# Patient Record
Sex: Female | Born: 1965 | Race: White | Hispanic: No | Marital: Married | Smoking: Former smoker
Health system: Southern US, Community
[De-identification: ages and names within clinical notes are randomized; demographics above are authoritative.]

## PROBLEM LIST (undated history)

## (undated) DIAGNOSIS — F419 Anxiety disorder, unspecified: Secondary | ICD-10-CM

## (undated) DIAGNOSIS — D352 Benign neoplasm of pituitary gland: Secondary | ICD-10-CM

## (undated) DIAGNOSIS — E079 Disorder of thyroid, unspecified: Secondary | ICD-10-CM

## (undated) DIAGNOSIS — F909 Attention-deficit hyperactivity disorder, unspecified type: Secondary | ICD-10-CM

## (undated) DIAGNOSIS — Z973 Presence of spectacles and contact lenses: Secondary | ICD-10-CM

## (undated) HISTORY — PX: ABDOMINAL HYSTERECTOMY: SHX81

## (undated) HISTORY — DX: Anxiety disorder, unspecified: F41.9

## (undated) HISTORY — DX: Benign neoplasm of pituitary gland: D35.2

## (undated) HISTORY — PX: KIDNEY STONE SURGERY: SHX686

---

## 2004-07-24 ENCOUNTER — Ambulatory Visit: Payer: Self-pay | Admitting: Unknown Physician Specialty

## 2004-10-09 ENCOUNTER — Ambulatory Visit: Payer: Self-pay | Admitting: Unknown Physician Specialty

## 2008-09-02 ENCOUNTER — Emergency Department: Payer: Self-pay | Admitting: Emergency Medicine

## 2008-12-20 ENCOUNTER — Ambulatory Visit: Payer: Self-pay | Admitting: Urology

## 2009-09-03 ENCOUNTER — Ambulatory Visit: Payer: Self-pay | Admitting: Family Medicine

## 2009-09-10 ENCOUNTER — Ambulatory Visit: Payer: Self-pay | Admitting: Unknown Physician Specialty

## 2009-09-25 ENCOUNTER — Ambulatory Visit: Payer: Self-pay | Admitting: Internal Medicine

## 2011-10-22 ENCOUNTER — Ambulatory Visit: Payer: Self-pay | Admitting: Internal Medicine

## 2012-02-12 ENCOUNTER — Ambulatory Visit: Payer: Self-pay | Admitting: Internal Medicine

## 2012-02-12 LAB — URINALYSIS, COMPLETE
Bilirubin,UR: NEGATIVE
Glucose,UR: NEGATIVE mg/dL (ref 0–75)
Ketone: NEGATIVE
Nitrite: NEGATIVE
Ph: 7 (ref 4.5–8.0)
Protein: NEGATIVE

## 2012-02-12 LAB — WET PREP, GENITAL

## 2012-02-15 LAB — URINE CULTURE

## 2012-11-01 ENCOUNTER — Ambulatory Visit: Payer: Self-pay | Admitting: Family Medicine

## 2013-06-13 ENCOUNTER — Ambulatory Visit: Payer: Self-pay | Admitting: Obstetrics and Gynecology

## 2013-06-13 LAB — CBC WITH DIFFERENTIAL/PLATELET
BASOS PCT: 0.9 %
Basophil #: 0.1 10*3/uL (ref 0.0–0.1)
EOS ABS: 0.1 10*3/uL (ref 0.0–0.7)
EOS PCT: 1.2 %
HCT: 38.3 % (ref 35.0–47.0)
HGB: 12.6 g/dL (ref 12.0–16.0)
Lymphocyte #: 1.5 10*3/uL (ref 1.0–3.6)
Lymphocyte %: 25.8 %
MCH: 30.8 pg (ref 26.0–34.0)
MCHC: 32.9 g/dL (ref 32.0–36.0)
MCV: 94 fL (ref 80–100)
MONO ABS: 0.4 x10 3/mm (ref 0.2–0.9)
Monocyte %: 6.2 %
Neutrophil #: 3.8 10*3/uL (ref 1.4–6.5)
Neutrophil %: 65.9 %
Platelet: 246 10*3/uL (ref 150–440)
RBC: 4.1 10*6/uL (ref 3.80–5.20)
RDW: 13.7 % (ref 11.5–14.5)
WBC: 5.8 10*3/uL (ref 3.6–11.0)

## 2013-06-13 LAB — COMPREHENSIVE METABOLIC PANEL
ALBUMIN: 4.1 g/dL (ref 3.4–5.0)
ALT: 19 U/L (ref 12–78)
AST: 18 U/L (ref 15–37)
Alkaline Phosphatase: 58 U/L
Anion Gap: 7 (ref 7–16)
BUN: 10 mg/dL (ref 7–18)
Bilirubin,Total: 0.3 mg/dL (ref 0.2–1.0)
CHLORIDE: 105 mmol/L (ref 98–107)
CO2: 30 mmol/L (ref 21–32)
Calcium, Total: 8.7 mg/dL (ref 8.5–10.1)
Creatinine: 0.86 mg/dL (ref 0.60–1.30)
EGFR (African American): >60
Glucose: 112 mg/dL — ABNORMAL HIGH (ref 65–99)
OSMOLALITY: 283 (ref 275–301)
POTASSIUM: 4.3 mmol/L (ref 3.5–5.1)
Sodium: 142 mmol/L (ref 136–145)
Total Protein: 7 g/dL (ref 6.4–8.2)

## 2013-06-13 LAB — LIPID PANEL
Cholesterol: 201 mg/dL — ABNORMAL HIGH (ref 0–200)
HDL Cholesterol: 96 mg/dL — ABNORMAL HIGH (ref 40–60)
LDL CHOLESTEROL, CALC: 88 mg/dL (ref 0–100)
Triglycerides: 83 mg/dL (ref 0–200)
VLDL Cholesterol, Calc: 17 mg/dL (ref 5–40)

## 2013-06-13 LAB — TSH: Thyroid Stimulating Horm: 1 u[IU]/mL

## 2013-07-14 ENCOUNTER — Ambulatory Visit: Payer: Self-pay

## 2013-07-14 LAB — URINALYSIS, COMPLETE
Bilirubin,UR: NEGATIVE
Glucose,UR: NEGATIVE mg/dL (ref 0–75)
KETONE: NEGATIVE
Leukocyte Esterase: NEGATIVE
Nitrite: NEGATIVE
PH: 5 (ref 4.5–8.0)
Protein: NEGATIVE
Specific Gravity: >=1.03 (ref 1.003–1.030)

## 2013-07-14 LAB — WET PREP, GENITAL

## 2013-07-14 LAB — GC/CHLAMYDIA PROBE AMP

## 2013-07-16 LAB — URINE CULTURE

## 2014-04-12 ENCOUNTER — Ambulatory Visit: Payer: Self-pay | Admitting: Internal Medicine

## 2014-09-21 ENCOUNTER — Ambulatory Visit
Admission: EM | Admit: 2014-09-21 | Discharge: 2014-09-21 | Disposition: A | Discharge status: Home / Self Care | Payer: BC Managed Care – PPO | Attending: Internal Medicine | Admitting: Internal Medicine

## 2014-09-21 ENCOUNTER — Encounter: Payer: Self-pay | Admitting: Emergency Medicine

## 2014-09-21 DIAGNOSIS — J302 Other seasonal allergic rhinitis: Secondary | ICD-10-CM

## 2014-09-21 DIAGNOSIS — H6593 Unspecified nonsuppurative otitis media, bilateral: Secondary | ICD-10-CM | POA: Diagnosis not present

## 2014-09-21 DIAGNOSIS — H6981 Other specified disorders of Eustachian tube, right ear: Secondary | ICD-10-CM

## 2014-09-21 HISTORY — DX: Disorder of thyroid, unspecified: E07.9

## 2014-09-21 MED ORDER — FEXOFENADINE HCL 60 MG PO TABS: 60.0000 mg | ORAL_TABLET | Freq: Two times a day (BID) | ORAL | Status: DC

## 2014-09-21 MED ORDER — ACETAMINOPHEN 500 MG PO TABS: 1000.0000 mg | ORAL_TABLET | Freq: Four times a day (QID) | ORAL | Status: AC | PRN

## 2014-09-21 MED ORDER — FLUTICASONE PROPIONATE 50 MCG/ACT NA SUSP: 1.0000 | Freq: Two times a day (BID) | NASAL | Status: DC

## 2014-09-21 MED ORDER — HYDROCODONE-ACETAMINOPHEN 5-325 MG PO TABS: 1.0000 | ORAL_TABLET | Freq: Two times a day (BID) | ORAL | Status: DC

## 2014-09-21 MED ORDER — SALINE SPRAY 0.65 % NA SOLN
2.0000 | NASAL | Status: DC
Start: 2014-09-21 — End: 2016-08-07

## 2014-09-21 MED ORDER — HYDROCODONE-ACETAMINOPHEN 5-325 MG PO TABS: 1.0000 | ORAL_TABLET | ORAL | Status: DC | PRN

## 2014-09-21 NOTE — ED Provider Notes (Signed)
CSN: [id number]     Arrival date & time 09/21/14  0848 History   First MD Initiated Contact with Patient 09/21/14 [id number]     Chief Complaint  Patient presents with  . 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  Reported normal temperature 98.7, chills last night, brush her hair yesterday sore scalp and occipital headache now with forehead right headache  Cannot tolerated advil/aleve upsets her stomach.  Has previously taken allegra and flonase for rhinitis but has not taken in months.  Patient is a 49 y.o. female presenting with ear pain. The history is provided by the patient.  Otalgia Location:  Right Behind ear:  No abnormality Quality:  Sharp Severity:  Moderate Onset quality:  Sudden Duration:  12 hours Timing:  Constant Progression:  Unchanged Chronicity:  New Context: not direct blow, not elevation change, not foreign body in ear, not loud noise and no water in ear   Relieved by:  Nothing Worsened by:  Position and swallowing Ineffective treatments:  OTC medications, palpation and position Associated symptoms: congestion and headaches   Associated symptoms: no abdominal pain, no cough, no diarrhea, no ear discharge, no fever, no hearing loss, no neck pain, no rash, no rhinorrhea, no sore throat, no tinnitus and no vomiting   Congestion:    Location:  Nasal   Interferes with sleep: no     Interferes with eating/drinking: no   Headaches:    Severity:  Moderate   Onset quality:  Sudden   Duration:  12 hours   Timing:  Constant   Progression:  Unchanged   Chronicity:  New Risk factors: no recent travel, no chronic ear infection and no prior ear surgery     Past Medical History  Diagnosis Date  . Thyroid disease     Past Surgical History  Procedure Laterality Date  . Abdominal hysterectomy     History reviewed. No pertinent family history. History  Substance Use Topics  . Smoking status: Former Research scientist (life sciences)  . Smokeless tobacco: Never Used  . Alcohol Use: No   OB History    No data available     Review of Systems  Constitutional: Positive for chills. Negative for fever, diaphoresis, activity change, appetite change and fatigue.  HENT: Positive for congestion, ear pain and postnasal drip. Negative for dental problem, drooling, ear discharge, facial swelling, hearing loss, mouth sores, nosebleeds, rhinorrhea, sinus pressure, sneezing, sore throat, tinnitus, trouble swallowing and voice change.   Eyes: Negative for photophobia, pain, discharge, redness, itching and visual disturbance.  Respiratory: Negative for cough, chest tightness, shortness of breath, wheezing and stridor.   Cardiovascular: Negative for chest pain, palpitations and leg swelling.  Gastrointestinal: Negative for nausea, vomiting, abdominal pain, diarrhea, constipation and abdominal distention.  Endocrine: Negative for cold intolerance and heat intolerance.  Genitourinary: Negative for dysuria, hematuria and difficulty urinating.  Musculoskeletal: Negative for myalgias, back pain, joint swelling, arthralgias, gait problem, neck pain and neck stiffness.  Skin: Negative for color change, pallor, rash and wound.  Allergic/Immunologic: Positive for environmental allergies. Negative for food allergies.  Neurological: Positive for headaches. Negative for dizziness, tremors, seizures, syncope, facial asymmetry, speech difficulty, weakness, light-headedness and numbness.  Hematological: Negative for adenopathy. Does not bruise/bleed easily.  Psychiatric/Behavioral: Positive for sleep disturbance. Negative for behavioral problems, confusion and agitation.  Allergies  Review of patient's allergies indicates no known allergies.  Home  Medications   Prior to Admission medications   Medication Sig Start Date End Date Taking? Authorizing Provider  estrogens, conjugated, (PREMARIN) 1.25 MG tablet Take 1.25 mg by mouth daily.   Yes Historical Provider, MD  acetaminophen (TYLENOL) 500 MG tablet Take 2 tablets (1,000 mg total) by mouth every 6 (six) hours as needed for moderate pain, fever or headache. 09/21/14   Olen Cordial, NP  fexofenadine (ALLEGRA) 60 MG tablet Take 1 tablet (60 mg total) by mouth 2 (two) times daily. 09/21/14   Olen Cordial, NP  fluticasone (FLONASE) 50 MCG/ACT nasal spray Place 1 spray into both nostrils 2 (two) times daily. 09/21/14   Olen Cordial, NP  HYDROcodone-acetaminophen (NORCO/VICODIN) 5-325 MG per tablet Take 1 tablet by mouth 2 (two) times daily. 09/21/14   Olen Cordial, NP  sodium chloride (OCEAN) 0.65 % SOLN nasal spray Place 2 sprays into both nostrils every 2 (two) hours while awake. 09/21/14   Aura Fey Caramia Boutin, NP   BP 106/51 mmHg  Pulse 87  Temp(Src) 99 F (37.2 C) (Tympanic)  Resp 16  Ht 5\' 6"  (1.676 m)  Wt 147 lb (66.679 kg)  BMI 23.74 kg/m2  SpO2 100% Physical Exam  Constitutional: She is oriented to person, place, and time. Vital signs are normal. She appears well-developed and well-nourished.  HENT:  Head: Normocephalic and atraumatic.  Right Ear: Hearing, external ear and ear canal normal. A middle ear effusion is present.  Left Ear: Hearing, external ear and ear canal normal. A middle ear effusion is present.  Nose: Mucosal edema present. No rhinorrhea, nose lacerations, sinus tenderness, nasal deformity, septal deviation or nasal septal hematoma. No epistaxis.  No foreign bodies. Right sinus exhibits frontal sinus tenderness. Right sinus exhibits no maxillary sinus tenderness. Left sinus exhibits frontal sinus tenderness. Left sinus exhibits no maxillary sinus tenderness.  Mouth/Throat: Uvula is midline and mucous membranes are normal. She does not have dentures.  No oral lesions. No trismus in the jaw. Normal dentition. No dental abscesses, uvula swelling, lacerations or dental caries. Posterior oropharyngeal edema and posterior oropharyngeal erythema present. No oropharyngeal exudate or tonsillar abscesses.  Cobblestoning posterior pharynx/ bilateral TMs with clear fluid  Eyes: Conjunctivae, EOM and lids are normal. Pupils are equal, round, and reactive to light. Right eye exhibits no discharge. Left eye exhibits no discharge. No scleral icterus.  Neck: Trachea normal and normal range of motion. Neck supple. No tracheal deviation present. No thyromegaly present.  Cardiovascular: Normal rate, regular rhythm, normal heart sounds and intact distal pulses.  Exam reveals no gallop and no friction rub.   No murmur heard. Pulmonary/Chest: Effort normal and breath sounds normal. No stridor. No respiratory distress. She has no wheezes. She has no rales. She exhibits no tenderness.  Abdominal: Soft. Bowel sounds are normal. She exhibits no distension and no mass. There is no tenderness. There is no rebound and no guarding.  Musculoskeletal: Normal range of motion. She exhibits no edema or tenderness.  Lymphadenopathy:    She has no cervical adenopathy.  Neurological: She is alert and oriented to person, place, and time. She exhibits normal muscle tone. Coordination normal.  Skin: Skin is warm, dry and intact. No rash noted. No erythema. No pallor.  Psychiatric: She has a normal mood and affect. Her speech is normal and behavior is normal. Judgment and thought content normal. Cognition and memory are normal.  Nursing note and vitals  reviewed.   ED Course  Procedures (including critical care time) Labs Review Labs Reviewed - No data to display  Imaging Review No results found.   MDM   1. Otitis media with effusion, bilateral   2. Eustachian tube dysfunction, right   3. Other seasonal allergic rhinitis    Plan: 1. Test/x-ray results and diagnosis  reviewed with patient 2. rx as per orders; risks, benefits, potential side effects reviewed with patient 3. Recommend supportive treatment with rest, fluids, stop using q tips in ears x 2 weeks, restart allergy medications 4. F/u prn if symptoms worsen or don't improve e.g. Ear discharge, fever greater than 100.5, dyspnea, dysphagia  Patient may use normal saline nasal spray as needed.  Consider antihistamine (restart allegra 60mg  po BID or may continue benadryl 25mg  po TID) or nasal steroid use (restart flonase 1 spray each nostril BID).  Ragweed season starting and it is an orange air quality day today.  Avoid triggers if possible.  Shower prior to bedtime if exposed to triggers.  If allergic dust/dust mites recommend mattress/pillow covers/encasements; washing linens, vacuuming, sweeping, dusting weekly.  Call or return to clinic as needed if these symptoms worsen or fail to improve as anticipated.   Exitcare handout on allergic rhinitis given to patient.  Patient verbalized understanding of instructions, agreed with plan of care and had no further questions at this time.  P2:  Avoidance and hand washing.  Work excuse for today may return to work Monday as scheduled 25 Jul.  Discussed no driving or alcohol intake if taking hydrocodone for pain.  Discussed research studies have shown may take up to 30 days for fluid in ears to resolve, steroids and antibiotics not helpful if no signs/symptoms of infection present.  Supportive treatment.   No evidence of invasive bacterial infection, non toxic and well hydrated.  This is most likely self limiting viral infection.  I do not see where any further testing or imaging is necessary at this time.   I will suggest supportive care, rest, good hygiene and encourage the patient to take adequate fluids.  The patient is to return to clinic or EMERGENCY ROOM if symptoms worsen or change significantly e.g. Worsening ear pain, fever greater than 100.5, purulent discharge  from ears or bleeding.  Exitcare handout on otitis media with effusion and eustachian tube dysfunction given to patient.  Patient verbalized agreement and understanding of treatment plan.       Olen Cordial, NP 09/21/14 1600

## 2014-09-21 NOTE — Discharge Instructions (Signed)
Allergic Rhinitis Allergic rhinitis is when the mucous membranes in the nose respond to allergens. Allergens are particles in the air that cause your body to have an allergic reaction. This causes you to release allergic antibodies. Through a chain of events, these eventually cause you to release histamine into the blood stream. Although meant to protect the body, it is this release of histamine that causes your discomfort, such as frequent sneezing, congestion, and an itchy, runny nose.  CAUSES  Seasonal allergic rhinitis (hay fever) is caused by pollen allergens that may come from grasses, trees, and weeds. Year-round allergic rhinitis (perennial allergic rhinitis) is caused by allergens such as house dust mites, pet dander, and mold spores.  SYMPTOMS   Nasal stuffiness (congestion).  Itchy, runny nose with sneezing and tearing of the eyes. DIAGNOSIS  Your health care provider can help you determine the allergen or allergens that trigger your symptoms. If you and your health care provider are unable to determine the allergen, skin or blood testing may be used. TREATMENT  Allergic rhinitis does not have a cure, but it can be controlled by:  Medicines and allergy shots (immunotherapy).  Avoiding the allergen. Hay fever may often be treated with antihistamines in pill or nasal spray forms. Antihistamines block the effects of histamine. There are over-the-counter medicines that may help with nasal congestion and swelling around the eyes. Check with your health care provider before taking or giving this medicine.  If avoiding the allergen or the medicine prescribed do not work, there are many new medicines your health care provider can prescribe. Stronger medicine may be used if initial measures are ineffective. Desensitizing injections can be used if medicine and avoidance does not work. Desensitization is when a patient is given ongoing shots until the body becomes less sensitive to the allergen.  Make sure you follow up with your health care provider if problems continue. HOME CARE INSTRUCTIONS It is not possible to completely avoid allergens, but you can reduce your symptoms by taking steps to limit your exposure to them. It helps to know exactly what you are allergic to so that you can avoid your specific triggers. SEEK MEDICAL CARE IF:   You have a fever.  You develop a cough that does not stop easily (persistent).  You have shortness of breath.  You start wheezing.  Symptoms interfere with normal daily activities. Document Released: 11/11/2000 Document Revised: 02/21/2013 Document Reviewed: 10/24/2012 [facility] Patient Information 2015 [place] [employer], [place]. This information is not intended to replace advice given to you by your health care provider. Make sure you discuss any questions you have with your health care provider. Sinusitis Sinusitis is redness, soreness, and inflammation of the paranasal sinuses. Paranasal sinuses are air pockets within the bones of your face (beneath the eyes, the middle of the forehead, or above the eyes). In healthy paranasal sinuses, mucus is able to drain out, and air is able to circulate through them by way of your nose. However, when your paranasal sinuses are inflamed, mucus and air can become trapped. This can allow bacteria and other germs to grow and cause infection. Sinusitis can develop quickly and last only a short time (acute) or continue over a long period (chronic). Sinusitis that lasts for more than 12 weeks is considered chronic.  CAUSES  Causes of sinusitis include: Allergies. Structural abnormalities, such as displacement of the cartilage that separates your nostrils (deviated septum), which can decrease the air flow through your nose and sinuses and affect sinus drainage. Functional  abnormalities, such as when the small hairs (cilia) that line your sinuses and help remove mucus do not work properly or are not present. SIGNS AND SYMPTOMS   Symptoms of acute and chronic sinusitis are the same. The primary symptoms are pain and pressure around the affected sinuses. Other symptoms include: Upper toothache. Earache. Headache. Bad breath. Decreased sense of smell and taste. A cough, which worsens when you are lying flat. Fatigue. Fever. Thick drainage from your nose, which often is green and may contain pus (purulent). Swelling and warmth over the affected sinuses. DIAGNOSIS  Your health care provider will perform a physical exam. During the exam, your health care provider may: Look in your nose for signs of abnormal growths in your nostrils (nasal polyps). Tap over the affected sinus to check for signs of infection. View the inside of your sinuses (endoscopy) using an imaging device that has a light attached (endoscope). If your health care provider suspects that you have chronic sinusitis, one or more of the following tests may be recommended: Allergy tests. Nasal culture. A sample of mucus is taken from your nose, sent to a lab, and screened for bacteria. Nasal cytology. A sample of mucus is taken from your nose and examined by your health care provider to determine if your sinusitis is related to an allergy. TREATMENT  Most cases of acute sinusitis are related to a viral infection and will resolve on their own within 10 days. Sometimes medicines are prescribed to help relieve symptoms (pain medicine, decongestants, nasal steroid sprays, or saline sprays).  However, for sinusitis related to a bacterial infection, your health care provider will prescribe antibiotic medicines. These are medicines that will help kill the bacteria causing the infection.  Rarely, sinusitis is caused by a fungal infection. In theses cases, your health care provider will prescribe antifungal medicine. For some cases of chronic sinusitis, surgery is needed. Generally, these are cases in which sinusitis recurs more than 3 times per year, despite other  treatments. HOME CARE INSTRUCTIONS  Drink plenty of water. Water helps thin the mucus so your sinuses can drain more easily. Use a humidifier. Inhale steam 3 to 4 times a day (for example, sit in the bathroom with the shower running). Apply a warm, moist washcloth to your face 3 to 4 times a day, or as directed by your health care provider. Use saline nasal sprays to help moisten and clean your sinuses. Take medicines only as directed by your health care provider. If you were prescribed either an antibiotic or antifungal medicine, finish it all even if you start to feel better. SEEK IMMEDIATE MEDICAL CARE IF: You have increasing pain or severe headaches. You have nausea, vomiting, or drowsiness. You have swelling around your face. You have vision problems. You have a stiff neck. You have difficulty breathing. MAKE SURE YOU:  Understand these instructions. Will watch your condition. Will get help right away if you are not doing well or get worse. Document Released: 02/16/2005 Document Revised: 07/03/2013 Document Reviewed: 03/03/2011 [facility] Patient Information 2015 [place], [place]. This information is not intended to replace advice given to you by your health care provider. Make sure you discuss any questions you have with your health care provider. Otitis Media With Effusion Otitis media with effusion is the presence of fluid in the middle ear. This is a common problem in children, which often follows ear infections. It may be present for weeks or longer after the infection. Unlike an acute ear infection, otitis media with  effusion refers only to fluid behind the ear drum and not infection. Children with repeated ear and sinus infections and allergy problems are the most likely to get otitis media with effusion. CAUSES  The most frequent cause of the fluid buildup is dysfunction of the eustachian tubes. These are the tubes that drain fluid in the ears to the back of the nose  (nasopharynx). SYMPTOMS  The main symptom of this condition is hearing loss. As a result, you or your child may: Listen to the TV at a loud volume. Not respond to questions. Ask "what" often when spoken to. Mistake or confuse one sound or word for another. There may be a sensation of fullness or pressure but usually not pain. DIAGNOSIS  Your health care provider will diagnose this condition by examining you or your child's ears. Your health care provider may test the pressure in you or your child's ear with a tympanometer. A hearing test may be conducted if the problem persists. TREATMENT  Treatment depends on the duration and the effects of the effusion. Antibiotics, decongestants, nose drops, and cortisone-type drugs (tablets or nasal spray) may not be helpful. Children with persistent ear effusions may have delayed language or behavioral problems. Children at risk for developmental delays in hearing, learning, and speech may require referral to a specialist earlier than children not at risk. You or your child's health care provider may suggest a referral to an ear, nose, and throat surgeon for treatment. The following may help restore normal hearing: Drainage of fluid. Placement of ear tubes (tympanostomy tubes). Removal of adenoids (adenoidectomy). HOME CARE INSTRUCTIONS  Avoid secondhand smoke. Infants who are breastfed are less likely to have this condition. Avoid feeding infants while they are lying flat. Avoid known environmental allergens. Avoid people who are sick. SEEK MEDICAL CARE IF:  Hearing is not better in 3 months. Hearing is worse. Ear pain. Drainage from the ear. Dizziness. MAKE SURE YOU:  Understand these instructions. Will watch your condition. Will get help right away if you are not doing well or get worse. Document Released: 03/26/2004 Document Revised: 07/03/2013 Document Reviewed: 09/13/2012 [facility] Patient Information 2015 [place], [place]. This  information is not intended to replace advice given to you by your health care provider. Make sure you discuss any questions you have with your health care provider. Barotitis Media Barotitis media is inflammation of your middle ear. This occurs when the auditory tube (eustachian tube) leading from the back of your nose (nasopharynx) to your eardrum is blocked. This blockage may result from a cold, environmental allergies, or an upper respiratory infection. Unresolved barotitis media may lead to damage or hearing loss (barotrauma), which may become permanent. HOME CARE INSTRUCTIONS   Use medicines as recommended by your health care provider. Over-the-counter medicines will help unblock the canal and can help during times of air travel.  Do not put anything into your ears to clean or unplug them. Eardrops will not be helpful.  Do not swim, dive, or fly until your health care provider says it is all right to do so. If these activities are necessary, chewing gum with frequent, forceful swallowing may help. It is also helpful to hold your nose and gently blow to pop your ears for equalizing pressure changes. This forces air into the eustachian tube.  Only take over-the-counter or prescription medicines for pain, discomfort, or fever as directed by your health care provider.  A decongestant may be helpful in decongesting the middle ear and make pressure equalization easier.  SEEK MEDICAL CARE IF:  You experience a serious form of dizziness in which you feel as if the room is spinning and you feel nauseated (vertigo).  Your symptoms only involve one ear. SEEK IMMEDIATE MEDICAL CARE IF:   You develop a severe headache, dizziness, or severe ear pain.  You have bloody or pus-like drainage from your ears.  You develop a fever.  Your problems do not improve or become worse. MAKE SURE YOU:   Understand these instructions.  Will watch your condition.  Will get help right away if you are not doing  well or get worse. Document Released: 02/14/2000 Document Revised: 12/07/2012 Document Reviewed: 09/13/2012 [facility] Patient Information 2015 [place], [place]. This information is not intended to replace advice given to you by your health care provider. Make sure you discuss any questions you have with your health care provider.

## 2014-09-21 NOTE — ED Notes (Signed)
Patient c/o right ear pain since last night.  Patient denies fevers.

## 2015-12-15 ENCOUNTER — Encounter: Payer: Self-pay | Admitting: *Deleted

## 2015-12-15 ENCOUNTER — Ambulatory Visit
Admission: EM | Admit: 2015-12-15 | Discharge: 2015-12-15 | Disposition: A | Discharge status: Home / Self Care | Payer: BC Managed Care – PPO | Attending: Family Medicine | Admitting: Family Medicine

## 2015-12-15 DIAGNOSIS — R21 Rash and other nonspecific skin eruption: Secondary | ICD-10-CM | POA: Diagnosis not present

## 2015-12-15 MED ORDER — VALACYCLOVIR HCL 1 G PO TABS: 1000.0000 mg | ORAL_TABLET | Freq: Three times a day (TID) | ORAL | 0 refills | Status: AC

## 2015-12-15 MED ORDER — NYSTATIN-TRIAMCINOLONE 100000-0.1 UNIT/GM-% EX CREA: TOPICAL_CREAM | CUTANEOUS | 0 refills | Status: DC

## 2015-12-15 NOTE — ED Provider Notes (Signed)
[facility] ____________________________________________  Time seen: Approximately 2:40 PM  I have reviewed the triage vital signs and the nursing notes.   HISTORY  Chief Complaint Rash  HPI Isabel Richards is a 50 y.o. female presenting for the complaints of rash to bilateral axilla area for the last 4 days. Patient reports she has tried multiple over-the-counter medications without any improvement. Patient states the rash is somewhat itchy and a burning sensation. Denies pain. Patient reports rash onset was 2 both axilla areas at the same time. Denies any known trigger. Denies any changes in foods, medicines, lotions, detergents. Denies any changes in the appearance. Patient denies any other rash. Patient reports that she feels well otherwise. Denies fevers.   Marden Noble, MD: PCP No LMP recorded. Patient has had a hysterectomy.   Past Medical History:  Diagnosis Date  . Thyroid disease     There are no active problems to display for this patient.   Past Surgical History:  Procedure Laterality Date  . ABDOMINAL HYSTERECTOMY      Current Outpatient Rx  . Order #: [id number] Class: Normal  . Order #: [id number] Class: Historical Med  . Order #: [id number] Class: Normal  . Order #: [id number] Class: Normal  . Order #: [id number] Class: Print  . Order #: [id number] Class: Normal  . Order #: [id number] Class: OTC  . Order #: [id number] Class: Normal    No current facility-administered medications for this encounter.   Current Outpatient Prescriptions:  .  acetaminophen (TYLENOL) 500 MG tablet, Take 2 tablets (1,000 mg total) by mouth every 6 (six) hours as needed for moderate pain, fever or headache., Disp: 30 tablet, Rfl: 0 .  estrogens, conjugated, (PREMARIN) 1.25 MG tablet, Take 1.25 mg by mouth daily., Disp: , Rfl:  .  fexofenadine (ALLEGRA) 60 MG tablet, Take 1 tablet (60 mg total) by mouth 2 (two) times daily., Disp: 60 tablet, Rfl: 0 .  fluticasone (FLONASE)  50 MCG/ACT nasal spray, Place 1 spray into both nostrils 2 (two) times daily., Disp: 16 g, Rfl: 0 .  HYDROcodone-acetaminophen (NORCO/VICODIN) 5-325 MG per tablet, Take 1 tablet by mouth 2 (two) times daily., Disp: 6 tablet, Rfl: 0 .  nystatin-triamcinolone (MYCOLOG II) cream, Apply sparingly to affected area(s) twice daily until resolved or 14 days, Disp: 15 g, Rfl: 0 .  sodium chloride (OCEAN) 0.65 % SOLN nasal spray, Place 2 sprays into both nostrils every 2 (two) hours while awake., Disp: , Rfl: 0 .  valACYclovir (VALTREX) 1000 MG tablet, Take 1 tablet (1,000 mg total) by mouth 3 (three) times daily., Disp: 21 tablet, Rfl: 0  Allergies Review of patient's allergies indicates no known allergies.  History reviewed. No pertinent family history.  Social History Social History  Substance Use Topics  . Smoking status: Former Research scientist (life sciences)  . Smokeless tobacco: Never Used  . Alcohol use Yes    Review of Systems Constitutional: No fever/chills Eyes: No visual changes. ENT: No sore throat. Cardiovascular: Denies chest pain. Respiratory: Denies shortness of breath. Gastrointestinal: No abdominal pain.  No nausea, no vomiting.  No diarrhea.  No constipation. Genitourinary: Negative for dysuria. Musculoskeletal: Negative for back pain. Skin: Positive for rash. Neurological: Negative for headaches, focal weakness or numbness.  10-point ROS otherwise negative.  ____________________________________________   PHYSICAL EXAM:  VITAL SIGNS: ED Triage Vitals  Enc Vitals Group     BP 12/15/15 1422 110/67     Pulse Rate 12/15/15 1422 84     Resp 12/15/15 1422 16     Temp  12/15/15 1422 97.9 F (36.6 C)     Temp Source 12/15/15 1422 Oral     SpO2 12/15/15 1422 100 %     Weight 12/15/15 1423 140 lb (63.5 kg)     Height 12/15/15 1423 5\' 5"  (1.651 m)     Head Circumference --      Peak Flow --      Pain Score --      Pain Loc --      Pain Edu? --      Excl. in [place]? --     Constitutional:  Alert and oriented. Well appearing and in no acute distress. Eyes: Conjunctivae are normal. PERRL. EOMI. ENT      Head: Normocephalic and atraumatic.      Nose: No congestion/rhinnorhea.      Mouth/Throat: Mucous membranes are moist.Oropharynx non-erythematous. Cardiovascular: Normal rate, regular rhythm. Grossly normal heart sounds.  Good peripheral circulation. Respiratory: Normal respiratory effort without tachypnea nor retractions. Breath sounds are clear and equal bilaterally. No wheezes/rales/rhonchi.. Gastrointestinal: Soft and nontender. No distention.  Musculoskeletal:  Ambulatory with steady gait.  No midline cervical, thoracic or lumbar tenderness to palpation.  Neurologic:  Normal speech and language. No gross focal neurologic deficits are appreciated. Speech is normal. No gait instability.  Skin:  Skin is warm, dry and intact. No rash noted. Except: Erythematous rash present to bilateral axilla, nontender, no induration, no fluctuance, nondistended, no surrounding erythema or rash. No other rash noted. Psychiatric: Mood and affect are normal. Speech and behavior are normal. Patient exhibits appropriate insight and judgment   ___________________________________________   LABS (all labs ordered are listed, but only abnormal results are displayed)  Labs Reviewed - No data to display  PROCEDURES Procedures    INITIAL IMPRESSION / ASSESSMENT AND PLAN / ED COURSE  Pertinent labs & imaging results that were available during my care of the patient were reviewed by me and considered in my medical decision making (see chart for details).  Very well-appearing patient. No acute distress. Presents for the complaints of rash to bilateral axillary area. Rash appears the same to both axillas. By a rash appearance and location suspect Candida skin infection. Patient expressed concern of shingles, discussed in detail with patient doubt shingles however patient requests treatment for  shingles conservatively, prescription for Valtrex also given in addition to the nystatin cream. Encouraged supportive care, keeping area dry, avoid air tense and avoid shaving at this time.  Discussed follow up with Primary care physician this week. Discussed follow up and return parameters including no resolution or any worsening concerns. Patient verbalized understanding and agreed to plan.   ____________________________________________   FINAL CLINICAL IMPRESSION(S) / ED DIAGNOSES  Final diagnoses:  Rash     Discharge Medication List as of 12/15/2015  2:45 PM    START taking these medications   Details  nystatin-triamcinolone (MYCOLOG II) cream Apply sparingly to affected area(s) twice daily until resolved or 14 days, Normal    valACYclovir (VALTREX) 1000 MG tablet Take 1 tablet (1,000 mg total) by mouth 3 (three) times daily., Starting Sun 12/15/2015, Until Sun 12/22/2015, Normal        Note: This dictation was prepared with Dragon dictation along with smaller phrase technology. Any transcriptional errors that result from this process are unintentional.    Clinical Course      Marylene Land, NP 12/15/15 1754

## 2015-12-15 NOTE — ED Triage Notes (Signed)
Bilat axillary rash x4 days. OTC meds not helping.

## 2015-12-15 NOTE — Discharge Instructions (Signed)
Take medication as prescribed. Keep clean and dry.   Follow up with your primary care physician this week.  Return to Urgent care for new or worsening concerns.

## 2016-03-18 ENCOUNTER — Ambulatory Visit: Payer: Self-pay | Admitting: Unknown Physician Specialty

## 2016-03-19 ENCOUNTER — Ambulatory Visit: Payer: Self-pay | Admitting: Unknown Physician Specialty

## 2016-08-07 ENCOUNTER — Ambulatory Visit (INDEPENDENT_AMBULATORY_CARE_PROVIDER_SITE_OTHER): Payer: BC Managed Care – PPO | Admitting: Unknown Physician Specialty

## 2016-08-07 ENCOUNTER — Encounter: Payer: Self-pay | Admitting: Unknown Physician Specialty

## 2016-08-07 VITALS — BP 111/78 | HR 90 | Temp 98.3°F | Ht 65.6 in | Wt 145.0 lb

## 2016-08-07 DIAGNOSIS — F909 Attention-deficit hyperactivity disorder, unspecified type: Secondary | ICD-10-CM

## 2016-08-07 DIAGNOSIS — F419 Anxiety disorder, unspecified: Secondary | ICD-10-CM | POA: Diagnosis not present

## 2016-08-07 DIAGNOSIS — F329 Major depressive disorder, single episode, unspecified: Secondary | ICD-10-CM | POA: Insufficient documentation

## 2016-08-07 DIAGNOSIS — Z780 Asymptomatic menopausal state: Secondary | ICD-10-CM | POA: Diagnosis not present

## 2016-08-07 DIAGNOSIS — F325 Major depressive disorder, single episode, in full remission: Secondary | ICD-10-CM

## 2016-08-07 DIAGNOSIS — F32A Depression, unspecified: Secondary | ICD-10-CM | POA: Insufficient documentation

## 2016-08-07 DIAGNOSIS — Z0000 Encounter for general adult medical examination without abnormal findings: Secondary | ICD-10-CM | POA: Diagnosis not present

## 2016-08-07 DIAGNOSIS — F1911 Other psychoactive substance abuse, in remission: Secondary | ICD-10-CM | POA: Insufficient documentation

## 2016-08-07 DIAGNOSIS — D352 Benign neoplasm of pituitary gland: Secondary | ICD-10-CM | POA: Insufficient documentation

## 2016-08-07 MED ORDER — ESTRADIOL 0.1 MG/24HR TD PTWK: 0.1000 mg | MEDICATED_PATCH | TRANSDERMAL | 12 refills | Status: DC

## 2016-08-07 NOTE — Assessment & Plan Note (Signed)
Per psychiatry 

## 2016-08-07 NOTE — Assessment & Plan Note (Signed)
Would like patches as she did well on these before

## 2016-08-07 NOTE — Patient Instructions (Signed)
Please do call to schedule your mammogram; the number to schedule one at either [facility] or [facility] is [phone]   

## 2016-08-07 NOTE — Progress Notes (Signed)
BP 111/78   Pulse 90   Temp 98.3 F (36.8 C)   Ht 5' 5.6" (1.666 m)   Wt 145 lb (65.8 kg)   SpO2 97%   BMI 23.69 kg/m    Subjective:    Patient ID: Isabel Richards, female    DOB: [birth year], 51 y.o.   MRN: [id number]  HPI: Isabel Richards is a 51 y.o. female  Chief Complaint  Patient presents with  . Establish Care   Pt is here to establish care.  She states her mother died of pancreatic cancer and feels she needs a physical.    N/V/D In the last 2 days with fever.  Slowly getting better and can keep down food and fluids.    ADHD Taking Ritalin for ADHD and has been on that for years.  Takes it only at work.    Anxiety/depression Wellbutrin and Clonazepam through psychiatry.  Taking Suboxone also through psychiatry.    Past Medical History:  Diagnosis Date  . Anxiety   . Pituitary adenoma ([place])   . Thyroid disease    Past Surgical History:  Procedure Laterality Date  . ABDOMINAL HYSTERECTOMY    . KIDNEY STONE SURGERY     removal   Family History  Problem Relation Age of Onset  . Cancer Mother        pancreatic  . Prostatitis Father   . Lymphoma Brother   . Stroke Maternal Grandfather   . Heart attack Maternal Grandfather   . Cancer Paternal Grandmother        breast  . Alzheimer's disease Paternal Grandmother   . Heart attack Paternal Grandfather    Social History   Social History  . Marital status: Married    Spouse name: N/A  . Number of children: N/A  . Years of education: N/A   Occupational History  . Not on file.   Social History Main Topics  . Smoking status: Former Smoker    Quit date: 08/08/2011  . Smokeless tobacco: Never Used  . Alcohol use No  . Drug use: No  . Sexual activity: Yes     Comment: married   Other Topics Concern  . Not on file   Social History Narrative  . No narrative on file     Relevant past medical, surgical, family and social history reviewed and updated as indicated. Interim medical history since  our last visit reviewed. Allergies and medications reviewed and updated.  Review of Systems  Constitutional: Negative.   HENT: Negative.   Eyes: Negative.   Respiratory: Negative.   Gastrointestinal: Positive for diarrhea, nausea and vomiting.  Genitourinary: Positive for difficulty urinating.  Musculoskeletal: Positive for back pain.  Allergic/Immunologic: Negative.   Neurological: Negative.   Hematological: Negative.   Psychiatric/Behavioral: Negative.     Per HPI unless specifically indicated above     Objective:    BP 111/78   Pulse 90   Temp 98.3 F (36.8 C)   Ht 5' 5.6" (1.666 m)   Wt 145 lb (65.8 kg)   SpO2 97%   BMI 23.69 kg/m   Wt Readings from Last 3 Encounters:  08/07/16 145 lb (65.8 kg)  12/15/15 140 lb (63.5 kg)  09/21/14 147 lb (66.7 kg)    Physical Exam  Constitutional: She is oriented to person, place, and time. She appears well-developed and well-nourished.  HENT:  Head: Normocephalic and atraumatic.  Eyes: Pupils are equal, round, and reactive to light. Right eye exhibits no discharge. Left  eye exhibits no discharge. No scleral icterus.  Neck: Normal range of motion. Neck supple. Carotid bruit is not present. No thyromegaly present.  Cardiovascular: Normal rate, regular rhythm and normal heart sounds.  Exam reveals no gallop and no friction rub.   No murmur heard. Pulmonary/Chest: Effort normal and breath sounds normal. No respiratory distress. She has no wheezes. She has no rales.  Abdominal: Soft. Bowel sounds are normal. There is no tenderness. There is no rebound.  Genitourinary: No breast swelling, tenderness or discharge.  Musculoskeletal: Normal range of motion.  Lymphadenopathy:    She has no cervical adenopathy.  Neurological: She is alert and oriented to person, place, and time.  Skin: Skin is warm, dry and intact. No rash noted.  Psychiatric: She has a normal mood and affect. Her speech is normal and behavior is normal. Judgment and  thought content normal. Cognition and memory are normal.    Results for orders placed or performed in visit on 07/14/13  Wet prep, genital  Result Value Ref Range   Micro Text Report         SOURCE: VAGINAL SWAB    COMMENT                   RARE WHITE BLOOD CELLS SEEN   COMMENT                   NO TRICHOMONAS,SPERMATOZOA,YEAST,OR CLUE CELLS SEEN   ANTIBIOTIC                                                      GC/Chlamydia Probe Amp  Result Value Ref Range   Micro Text Report         CHLAMYDIA                 CHLAMYDIA TRACHOMATIS NEGATIVE   N.GONORRHOEAE             N.GONORRHOEAE NEGATIVE   ANTIBIOTIC                                                      Urine culture  Result Value Ref Range   Micro Text Report         SOURCE: CLEAN CATCH    COMMENT                   MIXED BACTERIAL ORGANISMS   COMMENT                   RESULTS SUGGESTIVE OF CONTAMINATION   ANTIBIOTIC                                                      Urinalysis, Complete  Result Value Ref Range   Color - urine YELLOW    Clarity - urine HAZY    Glucose,UR NEGATIVE 0 - 75 mg/dL   Bilirubin,UR NEGATIVE NEGATIVE   Ketone NEGATIVE NEGATIVE   Specific Gravity >=1.030 1.003 - 1.030   Blood TRACE NEGATIVE   Ph  5.0 4.5 - 8.0   Protein NEGATIVE NEGATIVE   Nitrite NEGATIVE NEGATIVE   Leukocyte Esterase NEGATIVE NEGATIVE   RBC,UR 0-5 0 - 5 /HPF   WBC UR 0-5 0 - 5 /HPF   Bacteria 1+ (TRACE/FEW) NONE SEEN   Squamous Epithelial 15-30 / HPF       Assessment & Plan:   Problem List Items Addressed This Visit      Unprioritized   ADHD    Per psychiatry      Anxiety    Per psychiatry      Relevant Medications   buPROPion (WELLBUTRIN XL) 150 MG 24 hr tablet   Depression    Per psychiatry      Relevant Medications   buPROPion (WELLBUTRIN XL) 150 MG 24 hr tablet   Menopause    Would like patches as she did well on these before       Other Visit Diagnoses    Annual physical exam    -   Primary   Relevant Orders   HIV antibody   Lipid Panel w/o Chol/HDL Ratio   CBC with Differential/Platelet   Comprehensive metabolic panel   TSH   MM DIGITAL SCREENING BILATERAL   Ambulatory referral to Gastroenterology       Follow up plan: Return in about 1 year (around 08/07/2017).

## 2016-08-08 LAB — COMPREHENSIVE METABOLIC PANEL
A/G RATIO: 1.8 (ref 1.2–2.2)
ALT: 20 IU/L (ref 0–32)
AST: 25 IU/L (ref 0–40)
Albumin: 4.2 g/dL (ref 3.5–5.5)
Alkaline Phosphatase: 73 IU/L (ref 39–117)
BUN/Creatinine Ratio: 14 (ref 9–23)
BUN: 9 mg/dL (ref 6–24)
Bilirubin Total: <0.2 mg/dL (ref 0.0–1.2)
CALCIUM: 9 mg/dL (ref 8.7–10.2)
CO2: 24 mmol/L (ref 18–29)
Chloride: 104 mmol/L (ref 96–106)
Creatinine, Ser: 0.66 mg/dL (ref 0.57–1.00)
GFR, EST AFRICAN AMERICAN: 120 mL/min/{1.73_m2} (ref >59)
GFR, EST NON AFRICAN AMERICAN: 104 mL/min/{1.73_m2} (ref >59)
GLOBULIN, TOTAL: 2.3 g/dL (ref 1.5–4.5)
Glucose: 93 mg/dL (ref 65–99)
Potassium: 4 mmol/L (ref 3.5–5.2)
SODIUM: 141 mmol/L (ref 134–144)
TOTAL PROTEIN: 6.5 g/dL (ref 6.0–8.5)

## 2016-08-08 LAB — LIPID PANEL W/O CHOL/HDL RATIO
Cholesterol, Total: 156 mg/dL (ref 100–199)
HDL: 56 mg/dL (ref >39)
LDL Calculated: 66 mg/dL (ref 0–99)
Triglycerides: 170 mg/dL — ABNORMAL HIGH (ref 0–149)
VLDL Cholesterol Cal: 34 mg/dL (ref 5–40)

## 2016-08-08 LAB — CBC WITH DIFFERENTIAL/PLATELET
BASOS: 0 %
Basophils Absolute: 0 10*3/uL (ref 0.0–0.2)
EOS (ABSOLUTE): 0.2 10*3/uL (ref 0.0–0.4)
EOS: 4 %
HEMATOCRIT: 39.3 % (ref 34.0–46.6)
Hemoglobin: 13 g/dL (ref 11.1–15.9)
IMMATURE GRANS (ABS): 0 10*3/uL (ref 0.0–0.1)
Immature Granulocytes: 0 %
LYMPHS: 42 %
Lymphocytes Absolute: 1.9 10*3/uL (ref 0.7–3.1)
MCH: 30 pg (ref 26.6–33.0)
MCHC: 33.1 g/dL (ref 31.5–35.7)
MCV: 91 fL (ref 79–97)
MONOS ABS: 0.4 10*3/uL (ref 0.1–0.9)
Monocytes: 8 %
NEUTROS PCT: 46 %
Neutrophils Absolute: 2 10*3/uL (ref 1.4–7.0)
Platelets: 267 10*3/uL (ref 150–379)
RBC: 4.34 x10E6/uL (ref 3.77–5.28)
RDW: 13.4 % (ref 12.3–15.4)
WBC: 4.5 10*3/uL (ref 3.4–10.8)

## 2016-08-08 LAB — HIV ANTIBODY (ROUTINE TESTING W REFLEX): HIV Screen 4th Generation wRfx: NONREACTIVE

## 2016-08-08 LAB — TSH: TSH: 2.68 u[IU]/mL (ref 0.450–4.500)

## 2016-08-10 ENCOUNTER — Encounter: Payer: Self-pay | Admitting: Unknown Physician Specialty

## 2016-08-11 ENCOUNTER — Other Ambulatory Visit: Payer: Self-pay

## 2016-08-11 ENCOUNTER — Telehealth: Payer: Self-pay

## 2016-08-11 DIAGNOSIS — Z1211 Encounter for screening for malignant neoplasm of colon: Secondary | ICD-10-CM

## 2016-08-11 DIAGNOSIS — Z8371 Family history of colonic polyps: Secondary | ICD-10-CM

## 2016-08-11 NOTE — Telephone Encounter (Signed)
Gastroenterology Pre-Procedure Review  Request Date: 09/18/16 Requesting Physician: Dr. Allen Norris  PATIENT REVIEW QUESTIONS: The patient responded to the following health history questions as indicated:    1. Are you having any GI issues? no 2. Do you have a personal history of Polyps? no 3. Do you have a family history of Colon Cancer or Polyps? yes (grandmother and dad had colon polyps) 4. Diabetes Mellitus? no 5. Joint replacements in the past 12 months?no 6. Major health problems in the past 3 months?no 7. Any artificial heart valves, MVP, or defibrillator?no    MEDICATIONS & ALLERGIES:    Patient reports the following regarding taking any anticoagulation/antiplatelet therapy:   Plavix, Coumadin, Eliquis, Xarelto, Lovenox, Pradaxa, Brilinta, or Effient? no Aspirin? no  Patient confirms/reports the following medications:  Current Outpatient Prescriptions  Medication Sig Dispense Refill  . acetaminophen (TYLENOL) 500 MG tablet Take 2 tablets (1,000 mg total) by mouth every 6 (six) hours as needed for moderate pain, fever or headache. 30 tablet 0  . Buprenorphine HCl-Naloxone HCl (SUBOXONE) 2-0.5 MG FILM Place 2 mg under the tongue daily.     Marland Kitchen buPROPion (WELLBUTRIN XL) 150 MG 24 hr tablet Take 150 mg by mouth 2 (two) times daily.    . clonazePAM (KLONOPIN) 0.5 MG tablet TK 1 T PO QAM AND 2 TS QHS PRN. PLUS 1 T QD PRA  3  . estradiol (CLIMARA - DOSED IN MG/24 HR) 0.1 mg/24hr patch Place 1 patch (0.1 mg total) onto the skin once a week. 4 patch 12  . methylphenidate (RITALIN) 10 MG tablet Take by mouth. Take 1 to 2 tablets by mouth daily     No current facility-administered medications for this visit.     Patient confirms/reports the following allergies:  Allergies  Allergen Reactions  . Pollen Extract Other (See Comments)    Itchy eyes    No orders of the defined types were placed in this encounter.   AUTHORIZATION INFORMATION Primary Insurance: 1D#: Group #:  Secondary  Insurance: 1D#: Group #:  SCHEDULE INFORMATION: Date: 09/18/16 Time: Location:Dr. Allen Norris

## 2016-08-12 ENCOUNTER — Telehealth: Payer: Self-pay | Admitting: Unknown Physician Specialty

## 2016-08-12 NOTE — Telephone Encounter (Signed)
Patient requesting blood work results from Fridays 08/07/2016 visit.   Please Advise.  Thank you

## 2016-08-12 NOTE — Telephone Encounter (Signed)
Patient notified

## 2016-08-12 NOTE — Telephone Encounter (Signed)
Isabel Richards sent a letter 08/10/2016. Patient's results were normal. Will call patient and notify her.

## 2016-09-18 ENCOUNTER — Ambulatory Visit: Admit: 2016-09-18 | Payer: BC Managed Care – PPO | Admitting: Gastroenterology

## 2016-09-18 SURGERY — COLONOSCOPY WITH PROPOFOL
Anesthesia: General

## 2016-10-01 ENCOUNTER — Encounter: Payer: Self-pay | Admitting: *Deleted

## 2016-10-08 NOTE — Discharge Instructions (Signed)
General Anesthesia, Adult, Care After °These instructions provide you with information about caring for yourself after your procedure. Your health care provider may also give you more specific instructions. Your treatment has been planned according to current medical practices, but problems sometimes occur. Call your health care provider if you have any problems or questions after your procedure. °What can I expect after the procedure? °After the procedure, it is common to have: °· Vomiting. °· A sore throat. °· Mental slowness. ° °It is common to feel: °· Nauseous. °· Cold or shivery. °· Sleepy. °· Tired. °· Sore or achy, even in parts of your body where you did not have surgery. ° °Follow these instructions at home: °For at least 24 hours after the procedure: °· Do not: °? Participate in activities where you could fall or become injured. °? Drive. °? Use heavy machinery. °? Drink alcohol. °? Take sleeping pills or medicines that cause drowsiness. °? Make important decisions or sign legal documents. °? Take care of children on your own. °· Rest. °Eating and drinking °· If you vomit, drink water, juice, or soup when you can drink without vomiting. °· Drink enough fluid to keep your urine clear or pale yellow. °· Make sure you have little or no nausea before eating solid foods. °· Follow the diet recommended by your health care provider. °General instructions °· Have a responsible adult stay with you until you are awake and alert. °· Return to your normal activities as told by your health care provider. Ask your health care provider what activities are safe for you. °· Take over-the-counter and prescription medicines only as told by your health care provider. °· If you smoke, do not smoke without supervision. °· Keep all follow-up visits as told by your health care provider. This is important. °Contact a health care provider if: °· You continue to have nausea or vomiting at home, and medicines are not helpful. °· You  cannot drink fluids or start eating again. °· You cannot urinate after 8-12 hours. °· You develop a skin rash. °· You have fever. °· You have increasing redness at the site of your procedure. °Get help right away if: °· You have difficulty breathing. °· You have chest pain. °· You have unexpected bleeding. °· You feel that you are having a life-threatening or urgent problem. °This information is not intended to replace advice given to you by your health care provider. Make sure you discuss any questions you have with your health care provider. °Document Released: 05/25/2000 Document Revised: 07/22/2015 Document Reviewed: 01/31/2015 °Elsevier Interactive Patient Education © 2018 [employer] ° °

## 2016-10-09 ENCOUNTER — Ambulatory Visit: Payer: BC Managed Care – PPO | Admitting: Anesthesiology

## 2016-10-09 ENCOUNTER — Encounter
Admission: RE | Disposition: A | Discharge status: Home / Self Care | Payer: Self-pay | Source: Ambulatory Visit | Attending: Gastroenterology

## 2016-10-09 ENCOUNTER — Ambulatory Visit
Admission: RE | Admit: 2016-10-09 | Discharge: 2016-10-09 | Disposition: A | Discharge status: Home / Self Care | Payer: BC Managed Care – PPO | Source: Ambulatory Visit | Attending: Gastroenterology | Admitting: Gastroenterology

## 2016-10-09 DIAGNOSIS — Z1211 Encounter for screening for malignant neoplasm of colon: Secondary | ICD-10-CM

## 2016-10-09 DIAGNOSIS — D120 Benign neoplasm of cecum: Secondary | ICD-10-CM | POA: Diagnosis not present

## 2016-10-09 DIAGNOSIS — Z87891 Personal history of nicotine dependence: Secondary | ICD-10-CM | POA: Diagnosis not present

## 2016-10-09 DIAGNOSIS — Z79899 Other long term (current) drug therapy: Secondary | ICD-10-CM | POA: Diagnosis not present

## 2016-10-09 DIAGNOSIS — F419 Anxiety disorder, unspecified: Secondary | ICD-10-CM | POA: Insufficient documentation

## 2016-10-09 DIAGNOSIS — Z79890 Hormone replacement therapy (postmenopausal): Secondary | ICD-10-CM | POA: Insufficient documentation

## 2016-10-09 DIAGNOSIS — F909 Attention-deficit hyperactivity disorder, unspecified type: Secondary | ICD-10-CM | POA: Diagnosis not present

## 2016-10-09 HISTORY — PX: COLONOSCOPY WITH PROPOFOL: SHX5780

## 2016-10-09 HISTORY — DX: Presence of spectacles and contact lenses: Z97.3

## 2016-10-09 HISTORY — PX: POLYPECTOMY: SHX5525

## 2016-10-09 HISTORY — DX: Attention-deficit hyperactivity disorder, unspecified type: F90.9

## 2016-10-09 SURGERY — COLONOSCOPY WITH PROPOFOL
Anesthesia: General | Wound class: Contaminated

## 2016-10-09 MED ORDER — LIDOCAINE HCL (CARDIAC) 20 MG/ML IV SOLN
INTRAVENOUS | Status: DC | PRN
  Administered 2016-10-09: 50 mg via INTRAVENOUS

## 2016-10-09 MED ORDER — LACTATED RINGERS IV SOLN
10.0000 mL/h | INTRAVENOUS | Status: DC
  Administered 2016-10-09: 10 mL/h via INTRAVENOUS

## 2016-10-09 MED ORDER — STERILE WATER FOR IRRIGATION IR SOLN
Status: DC | PRN
  Administered 2016-10-09: 10:00:00

## 2016-10-09 MED ORDER — SODIUM CHLORIDE 0.9 % IJ SOLN
INTRAMUSCULAR | Status: DC | PRN
  Administered 2016-10-09: 10 mL

## 2016-10-09 MED ORDER — PROPOFOL 10 MG/ML IV BOLUS
INTRAVENOUS | Status: DC | PRN
  Administered 2016-10-09 (×13): 20 mg via INTRAVENOUS
  Administered 2016-10-09: 80 mg via INTRAVENOUS

## 2016-10-09 SURGICAL SUPPLY — 23 items
CANISTER SUCT 1200ML W/VALVE (MISCELLANEOUS) ×4 IMPLANT
CLIP HMST 235XBRD CATH ROT (MISCELLANEOUS) ×6 IMPLANT
CLIP RESOLUTION 360 11X235 (MISCELLANEOUS) ×6
FCP ESCP3.2XJMB 240X2.8X (MISCELLANEOUS)
FORCEPS BIOP RAD 4 LRG CAP 4 (CUTTING FORCEPS) IMPLANT
FORCEPS BIOP RJ4 240 W/NDL (MISCELLANEOUS)
FORCEPS ESCP3.2XJMB 240X2.8X (MISCELLANEOUS) IMPLANT
GOWN CVR UNV OPN BCK APRN NK (MISCELLANEOUS) ×4 IMPLANT
GOWN ISOL THUMB LOOP REG UNIV (MISCELLANEOUS) ×4
INJECTOR VARIJECT VIN23 (MISCELLANEOUS) ×2 IMPLANT
KIT DEFENDO VALVE AND CONN (KITS) IMPLANT
KIT ENDO PROCEDURE OLY (KITS) ×4 IMPLANT
MARKER SPOT ENDO TATTOO 5ML (MISCELLANEOUS) IMPLANT
PAD GROUND ADULT SPLIT (MISCELLANEOUS) ×4 IMPLANT
PROBE APC STR FIRE (PROBE) IMPLANT
RETRIEVER NET ROTH 2.5X230 LF (MISCELLANEOUS) ×4 IMPLANT
SNARE SHORT THROW 13M SML OVAL (MISCELLANEOUS) ×4 IMPLANT
SNARE SHORT THROW 30M LRG OVAL (MISCELLANEOUS) IMPLANT
SNARE SNG USE RND 15MM (INSTRUMENTS) IMPLANT
SPOT EX ENDOSCOPIC TATTOO (MISCELLANEOUS)
TRAP ETRAP POLY (MISCELLANEOUS) ×4 IMPLANT
VARIJECT INJECTOR VIN23 (MISCELLANEOUS) ×4
WATER STERILE IRR 250ML POUR (IV SOLUTION) ×4 IMPLANT

## 2016-10-09 NOTE — Op Note (Signed)
[facility] Gastroenterology Patient Name: Isabel Richards Procedure Date: 10/09/2016 10:01 AM MRN: [id number] Account #: [id number] Date of Birth: 1965-11-22 Admit Type: Outpatient Age: 51 Room: [facility] OR ROOM 01 Gender: Female Note Status: Finalized Procedure:            Colonoscopy Indications:          Screening for colorectal malignant neoplasm Providers:            [name] MD, MD Referring MD:         Kathrine Haddock (Referring MD) Medicines:            Propofol per Anesthesia Complications:        No immediate complications. Procedure:            Pre-Anesthesia Assessment:                       - Prior to the procedure, a History and Physical was                        performed, and patient medications and allergies were                        reviewed. The patient's tolerance of previous                        anesthesia was also reviewed. The risks and benefits of                        the procedure and the sedation options and risks were                        discussed with the patient. All questions were                        answered, and informed consent was obtained. Prior                        Anticoagulants: The patient has taken no previous                        anticoagulant or antiplatelet agents. ASA Grade                        Assessment: II - A patient with mild systemic disease.                        After reviewing the risks and benefits, the patient was                        deemed in satisfactory condition to undergo the                        procedure.                       After obtaining informed consent, the colonoscope was                        passed under direct vision. Throughout the procedure,  the patient's blood pressure, pulse, and oxygen                        saturations were monitored continuously. The Olympus                        CF-HQ190L Colonoscope (S#. [id number]) was introduced               through the anus and advanced to the the cecum,                        identified by appendiceal orifice and ileocecal valve.                        The colonoscopy was performed without difficulty. The                        patient tolerated the procedure well. The quality of                        the bowel preparation was poor. Findings:      The perianal and digital rectal examinations were normal.      A 12 mm polyp was found in the cecum. The polyp was sessile. Area was       successfully injected with 10 mL saline for a lift polypectomy. The       polyp was removed with a hot snare. Resection and retrieval were       complete. To prevent bleeding post-intervention, three hemostatic clips       were successfully placed (MR conditional). There was no bleeding at the       end of the procedure.      Stool was found in the entire colon, precluding visualization. Impression:           - Preparation of the colon was poor.                       - One 12 mm polyp in the cecum, removed with a hot                        snare. Resected and retrieved. Injected. Clips (MR                        conditional) were placed.                       - Stool in the entire examined colon. Recommendation:       - Discharge patient to home.                       - Resume previous diet.                       - Continue present medications.                       - Await pathology results.                       - Repeat colonoscopy in 6 months because the bowel  preparation was poor. Procedure Code(s):    --- Professional ---                       [phone], Colonoscopy, flexible; with removal of tumor(s),                        polyp(s), or other lesion(s) by snare technique                       [id number], Colonoscopy, flexible; with directed submucosal                        injection(s), any substance Diagnosis Code(s):    --- Professional ---                       Z12.11,  Encounter for screening for malignant neoplasm                        of colon                       D12.0, Benign neoplasm of cecum CPT copyright 2016 [employer]. All rights reserved. The codes documented in this report are preliminary and upon coder review may  be revised to meet current compliance requirements. [name] MD, MD 10/09/2016 10:44:14 AM This report has been signed electronically. Number of Addenda: 0 Note Initiated On: 10/09/2016 10:01 AM Scope Withdrawal Time: 0 hours 10 minutes 13 seconds  Total Procedure Duration: 0 hours 28 minutes 41 seconds       [facility]

## 2016-10-09 NOTE — H&P (Signed)
Isabel Lame, MD [facility] [address], [place], [place] [address] Phone: [phone] Fax : [phone]  Primary Care Physician:  Kathrine Haddock, NP Primary Gastroenterologist:  Dr. Allen Norris  Pre-Procedure History & Physical: HPI:  Isabel Richards is a 51 y.o. female is here for a screening colonoscopy.   Past Medical History:  Diagnosis Date  . ADHD   . Anxiety   . Pituitary adenoma ([place])   . Thyroid disease   . Wears contact lenses    right eye    Past Surgical History:  Procedure Laterality Date  . ABDOMINAL HYSTERECTOMY    . KIDNEY STONE SURGERY     removal    Prior to Admission medications   Medication Sig Start Date End Date Taking? Authorizing Provider  acetaminophen (TYLENOL) 500 MG tablet Take 2 tablets (1,000 mg total) by mouth every 6 (six) hours as needed for moderate pain, fever or headache. 09/21/14  Yes Betancourt, Aura Fey, NP  Buprenorphine HCl-Naloxone HCl (SUBOXONE) 2-0.5 MG FILM Place 2 mg under the tongue daily.  06/19/16  Yes [provider]  buPROPion (WELLBUTRIN XL) 150 MG 24 hr tablet Take 150 mg by mouth 2 (two) times daily.   Yes [provider]  clonazePAM (KLONOPIN) 0.5 MG tablet TK 1 T PO QAM AND 2 TS QHS PRN. PLUS 1 T QD PRA 07/18/16  Yes [provider]  estradiol (CLIMARA - DOSED IN MG/24 HR) 0.1 mg/24hr patch Place 1 patch (0.1 mg total) onto the skin once a week. 08/07/16  Yes Kathrine Haddock, NP  methylphenidate (RITALIN) 10 MG tablet Take by mouth. Take 1 to 2 tablets by mouth daily 06/19/16  Yes [provider]    Allergies as of 08/11/2016 - Review Complete 08/07/2016  Allergen Reaction Noted  . Pollen extract Other (See Comments) 07/12/2012    Family History  Problem Relation Age of Onset  . Cancer Mother        pancreatic  . Prostatitis Father   . Lymphoma Brother   . Stroke Maternal Grandfather   . Heart attack Maternal Grandfather   . Cancer Paternal Grandmother        breast  .  Alzheimer's disease Paternal Grandmother   . Heart attack Paternal Grandfather     Social History   Social History  . Marital status: Married    Spouse name: N/A  . Number of children: N/A  . Years of education: N/A   Occupational History  . Not on file.   Social History Main Topics  . Smoking status: Former Smoker    Quit date: 08/08/2011  . Smokeless tobacco: Never Used     Comment: husband still smokes  . Alcohol use No  . Drug use: No  . Sexual activity: Yes     Comment: married   Other Topics Concern  . Not on file   Social History Narrative  . No narrative on file    Review of Systems: See HPI, otherwise negative ROS  Physical Exam: BP 109/60   Pulse 81   Temp (!) 97.3 F (36.3 C) (Temporal)   Ht 5' 5.6" (1.666 m)   Wt 150 lb (68 kg)   SpO2 97%   BMI 24.51 kg/m  General:   Alert,  pleasant and cooperative in NAD Head:  Normocephalic and atraumatic. Neck:  Supple; no masses or thyromegaly. Lungs:  Clear throughout to auscultation.    Heart:  Regular rate and rhythm. Abdomen:  Soft, nontender and nondistended. Normal  bowel sounds, without guarding, and without rebound.   Neurologic:  Alert and  oriented x4;  grossly normal neurologically.  Impression/Plan: [name] is now here to undergo a screening colonoscopy.  Risks, benefits, and alternatives regarding colonoscopy have been reviewed with the patient.  Questions have been answered.  All parties agreeable.

## 2016-10-09 NOTE — Anesthesia Procedure Notes (Signed)
Performed by: Semya Klinke Pre-anesthesia Checklist: Patient identified, Emergency Drugs available, Suction available, Timeout performed and Patient being monitored Patient Re-evaluated:Patient Re-evaluated prior to induction Oxygen Delivery Method: Nasal cannula Placement Confirmation: positive ETCO2       

## 2016-10-09 NOTE — Anesthesia Preprocedure Evaluation (Signed)
Anesthesia Evaluation  Patient identified by MRN, date of birth, ID band Patient awake    Reviewed: Allergy & Precautions, H&P , NPO status , Patient's Chart, lab work & pertinent test results  Airway Mallampati: II  TM Distance: >3 FB Neck ROM: full    Dental no notable dental hx.    Pulmonary neg pulmonary ROS, former smoker,    Pulmonary exam normal breath sounds clear to auscultation       Cardiovascular Exercise Tolerance: Good negative cardio ROS   Rhythm:regular Rate:Normal     Neuro/Psych ADHD AnxietyPituitary adenoma negative psych ROS   GI/Hepatic negative GI ROS, Neg liver ROS,   Endo/Other  negative endocrine ROS  Renal/GU negative Renal ROS  negative genitourinary   Musculoskeletal   Abdominal   Peds  Hematology negative hematology ROS (+)   Anesthesia Other Findings   Reproductive/Obstetrics negative OB ROS                             Anesthesia Physical Anesthesia Plan  ASA: II  Anesthesia Plan: General   Post-op Pain Management:    Induction:   PONV Risk Score and Plan:   Airway Management Planned:   Additional Equipment:   Intra-op Plan:   Post-operative Plan:   Informed Consent: I have reviewed the patients History and Physical, chart, labs and discussed the procedure including the risks, benefits and alternatives for the proposed anesthesia with the patient or authorized representative who has indicated his/her understanding and acceptance.   Dental Advisory Given  Plan Discussed with: CRNA  Anesthesia Plan Comments:         Anesthesia Quick Evaluation

## 2016-10-09 NOTE — Anesthesia Postprocedure Evaluation (Signed)
Anesthesia Post Note  Patient: Isabel Richards  Procedure(s) Performed: Procedure(s) (LRB): COLONOSCOPY WITH PROPOFOL (N/A) POLYPECTOMY  Patient location during evaluation: PACU Anesthesia Type: General Level of consciousness: awake and alert Pain management: pain level controlled Vital Signs Assessment: post-procedure vital signs reviewed and stable Respiratory status: spontaneous breathing, nonlabored ventilation, respiratory function stable and patient connected to nasal cannula oxygen Cardiovascular status: blood pressure returned to baseline and stable Postop Assessment: no signs of nausea or vomiting Anesthetic complications: no    [name]

## 2016-10-09 NOTE — Transfer of Care (Signed)
Immediate Anesthesia Transfer of Care Note  Patient: Isabel Richards  Procedure(s) Performed: Procedure(s): COLONOSCOPY WITH PROPOFOL (N/A) POLYPECTOMY  Patient Location: PACU  Anesthesia Type: General  Level of Consciousness: awake, alert  and patient cooperative  Airway and Oxygen Therapy: Patient Spontanous Breathing and Patient connected to supplemental oxygen  Post-op Assessment: Post-op Vital signs reviewed, Patient's Cardiovascular Status Stable, Respiratory Function Stable, Patent Airway and No signs of Nausea or vomiting  Post-op Vital Signs: Reviewed and stable  Complications: No apparent anesthesia complications

## 2016-10-12 ENCOUNTER — Encounter: Payer: Self-pay | Admitting: Gastroenterology

## 2016-10-13 ENCOUNTER — Encounter: Payer: Self-pay | Admitting: Gastroenterology

## 2016-10-14 ENCOUNTER — Other Ambulatory Visit: Payer: Self-pay

## 2016-10-15 ENCOUNTER — Telehealth: Payer: Self-pay | Admitting: Gastroenterology

## 2016-10-15 NOTE — Telephone Encounter (Signed)
Patient called wanting biopsy resutls.

## 2016-10-16 ENCOUNTER — Telehealth: Payer: Self-pay | Admitting: Gastroenterology

## 2016-10-16 NOTE — Telephone Encounter (Signed)
Patient left a voice message that she is calling back for results. Please call [phone]

## 2016-10-16 NOTE — Telephone Encounter (Signed)
Pt notified of colonoscopy results.  

## 2016-12-11 ENCOUNTER — Ambulatory Visit: Payer: BC Managed Care – PPO | Admitting: Unknown Physician Specialty

## 2016-12-15 ENCOUNTER — Encounter: Payer: Self-pay | Admitting: *Deleted

## 2016-12-15 ENCOUNTER — Ambulatory Visit
Admission: EM | Admit: 2016-12-15 | Discharge: 2016-12-15 | Disposition: A | Discharge status: Home / Self Care | Payer: BC Managed Care – PPO | Attending: Family Medicine | Admitting: Family Medicine

## 2016-12-15 DIAGNOSIS — R300 Dysuria: Secondary | ICD-10-CM | POA: Diagnosis not present

## 2016-12-15 DIAGNOSIS — R350 Frequency of micturition: Secondary | ICD-10-CM | POA: Diagnosis not present

## 2016-12-15 LAB — URINALYSIS, COMPLETE (UACMP) WITH MICROSCOPIC
Bilirubin Urine: NEGATIVE
GLUCOSE, UA: NEGATIVE mg/dL
Ketones, ur: NEGATIVE mg/dL
Leukocytes, UA: NEGATIVE
Nitrite: NEGATIVE
Protein, ur: NEGATIVE mg/dL
SPECIFIC GRAVITY, URINE: 1.01 (ref 1.005–1.030)
pH: 6 (ref 5.0–8.0)

## 2016-12-15 MED ORDER — NITROFURANTOIN MONOHYD MACRO 100 MG PO CAPS: 100.0000 mg | ORAL_CAPSULE | Freq: Two times a day (BID) | ORAL | 0 refills | Status: DC

## 2016-12-15 NOTE — ED Triage Notes (Signed)
Dysuria,urinary freq/urg, since Saturday. Hx of UTIs.

## 2016-12-15 NOTE — ED Provider Notes (Signed)
[facility]    CSN: [id number] Arrival date & time: 12/15/16  1724  History   Chief Complaint Chief Complaint  Patient presents with  . Dysuria  . Urinary Frequency   HPI  51 year old female presents with the above complaints.  Patient reports that she's had dysuria and urinary frequency since Saturday. Symptoms are severe and worsening. She been drinking cranberry juice without improvement. No associated fevers or chills. No flank pain. Hematuria. No known exacerbating factors. No other associated symptoms. No other complaints or concerns at this time.  Past Medical History:  Diagnosis Date  . ADHD   . Anxiety   . Pituitary adenoma ([place])   . Thyroid disease   . Wears contact lenses    right eye   Patient Active Problem List   Diagnosis Date Noted  . Special screening for malignant neoplasms, colon   . Benign neoplasm of cecum   . ADHD 08/07/2016  . Anxiety 08/07/2016  . Depression 08/07/2016  . Pituitary adenoma ([place]) 08/07/2016  . History of substance abuse 08/07/2016  . Menopause 08/07/2016   Past Surgical History:  Procedure Laterality Date  . ABDOMINAL HYSTERECTOMY    . COLONOSCOPY WITH PROPOFOL N/A 10/09/2016   Procedure: COLONOSCOPY WITH PROPOFOL;  Surgeon: [name], MD;  Location: [facility];  Service: Endoscopy;  Laterality: N/A;  . KIDNEY STONE SURGERY     removal  . POLYPECTOMY  10/09/2016   Procedure: POLYPECTOMY;  Surgeon: [name], MD;  Location: [facility];  Service: Endoscopy;;    OB History    No data available       Home Medications    Prior to Admission medications   Medication Sig Start Date End Date Taking? Authorizing Provider  acetaminophen (TYLENOL) 500 MG tablet Take 2 tablets (1,000 mg total) by mouth every 6 (six) hours as needed for moderate pain, fever or headache. 09/21/14  Yes Betancourt, Aura Fey, NP  Buprenorphine HCl-Naloxone HCl (SUBOXONE) 2-0.5 MG FILM Place 2 mg under the tongue daily.   06/19/16  Yes [provider]  buPROPion (WELLBUTRIN XL) 150 MG 24 hr tablet Take 150 mg by mouth 2 (two) times daily.   Yes [provider]  clonazePAM (KLONOPIN) 0.5 MG tablet TK 1 T PO QAM AND 2 TS QHS PRN. PLUS 1 T QD PRA 07/18/16  Yes [provider]  methylphenidate (RITALIN) 10 MG tablet Take by mouth. Take 1 to 2 tablets by mouth daily 06/19/16  Yes [provider]  nitrofurantoin, macrocrystal-monohydrate, (MACROBID) 100 MG capsule Take 1 capsule (100 mg total) by mouth 2 (two) times daily. 12/15/16   Coral Spikes, DO    Family History Family History  Problem Relation Age of Onset  . Cancer Mother        pancreatic  . Prostatitis Father   . Lymphoma Brother   . Stroke Maternal Grandfather   . Heart attack Maternal Grandfather   . Cancer Paternal Grandmother        breast  . Alzheimer's disease Paternal Grandmother   . Heart attack Paternal Grandfather     Social History Social History  Substance Use Topics  . Smoking status: Former Smoker    Quit date: 08/08/2011  . Smokeless tobacco: Never Used     Comment: husband still smokes  . Alcohol use No   Allergies   Pollen extract  Review of Systems Review of Systems  Constitutional: Negative for chills and fever.  Gastrointestinal: Negative.   Genitourinary: Positive  for dysuria and frequency. Negative for flank pain.   Physical Exam Triage Vital Signs ED Triage Vitals  Enc Vitals Group     BP 12/15/16 1757 117/60     Pulse Rate 12/15/16 1757 89     Resp 12/15/16 1757 16     Temp 12/15/16 1757 98.6 F (37 C)     Temp Source 12/15/16 1757 Oral     SpO2 12/15/16 1757 100 %     Weight 12/15/16 1800 145 lb (65.8 kg)     Height 12/15/16 1800 5\' 6"  (1.676 m)     Head Circumference --      Peak Flow --      Pain Score --      Pain Loc --      Pain Edu? --      Excl. in [place]? --    Updated Vital Signs BP 117/60 (BP Location: Left Arm)   Pulse 89   Temp 98.6 F (37 C) (Oral)    Resp 16   Ht 5\' 6"  (1.676 m)   Wt 145 lb (65.8 kg)   SpO2 100%   BMI 23.40 kg/m  Physical Exam  Constitutional: She is oriented to person, place, and time. She appears well-developed. No distress.  Cardiovascular: Normal rate and regular rhythm.   No murmur heard. Pulmonary/Chest: Effort normal and breath sounds normal. She has no wheezes. She has no rales.  Abdominal: Soft. She exhibits no distension. There is no tenderness.  Neurological: She is alert and oriented to person, place, and time.  Psychiatric: She has a normal mood and affect.  Vitals reviewed.  UC Treatments / Results  Labs (all labs ordered are listed, but only abnormal results are displayed) Labs Reviewed  URINALYSIS, COMPLETE (UACMP) WITH MICROSCOPIC - Abnormal; Notable for the following:       Result Value   Hgb urine dipstick SMALL (*)    Squamous Epithelial / LPF 6-30 (*)    Bacteria, UA RARE (*)    All other components within normal limits  URINE CULTURE    EKG  EKG Interpretation None       Radiology No results found.  Procedures Procedures (including critical care time)  Medications Ordered in UC Medications - No data to display   Initial Impression / Assessment and Plan / UC Course  I have reviewed the triage vital signs and the nursing notes.  Pertinent labs & imaging results that were available during my care of the patient were reviewed by me and considered in my medical decision making (see chart for details).   51 year old female presents with dysuria and urinary frequency. Symptoms suggestive of UTI. Urinalysis with small hemoglobin but 0-5 red blood cells seen on microscopy. No pyuria. Rare bacteria. 6-30 squamous epithelial cells noted. Patient is adamant that she has UTI. I feel like this is more consistent with low estrogen state. I recommend a repeat urinanalysis given 6-30 squamous cells. Patient unable to produce initial urine. Given her worsening symptoms, I'm treating her  empirically for UTI while awaiting culture.  Final Clinical Impressions(s) / UC Diagnoses   Final diagnoses:  Dysuria    New Prescriptions Discharge Medication List as of 12/15/2016  6:37 PM    START taking these medications   Details  nitrofurantoin, macrocrystal-monohydrate, (MACROBID) 100 MG capsule Take 1 capsule (100 mg total) by mouth 2 (two) times daily., Starting Tue 12/15/2016, Normal       Controlled Substance Prescriptions [place] Controlled Substance Registry consulted? Not Applicable  Coral Spikes, [place] 12/15/16 [phone]

## 2016-12-15 NOTE — Discharge Instructions (Signed)
Take the antibiotic as directed, while we wait on the culture.  Based on your urine, I do not think you have a UTI but will treat based on symptoms.  Take care  Dr. Lacinda Axon

## 2017-01-23 IMAGING — MR MRI ORBITS WITHOUT AND WITH CONTRAST
15 of 18 series · 42 of 48 positions shown · IV contrast (agent unspecified)
Comparison: MRI 10/22/2011

CONTRAST:  7 mL MultiHance IV

CLINICAL DATA: Pituitary microadenoma.

EXAM:
MRI HEAD WITH CONTRAST
TECHNIQUE: Multiplanar, multiecho pulse sequences of the brain and surrounding
structures were obtained with intravenous contrast.

[Series 2: T1 · sagittal · 5.0mm · 0.45mm/px · 2 of 23 slices shown]
[im 1/23]
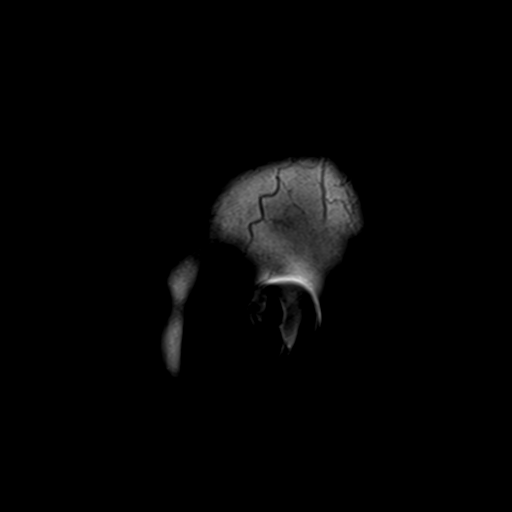
[im 12/23]
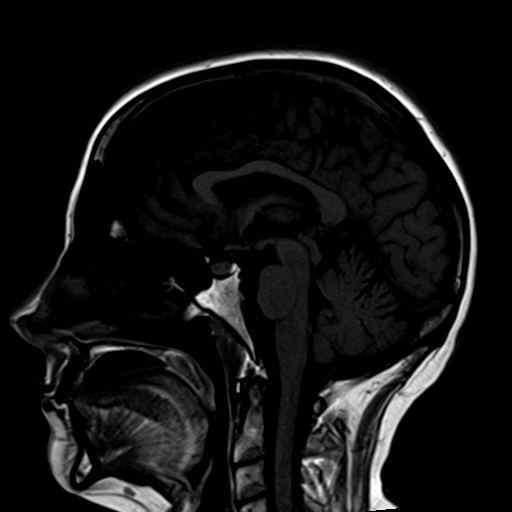

[Series 5: T2 · axial · 5.0mm · 0.90mm/px · z∈[-26,+117]mm · 3 of 25 slices shown (1 of 2)]
[im 1/25]
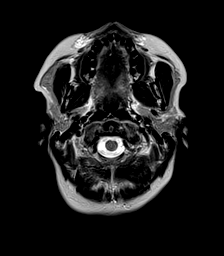
[im 13/25]
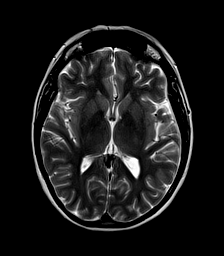
[im 25/25]
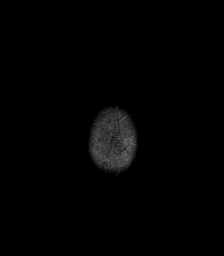

[Series 6: FLAIR · axial · 5.0mm · 0.45mm/px · z∈[-26,+117]mm · 3 of 25 slices shown]
[im 1/25]
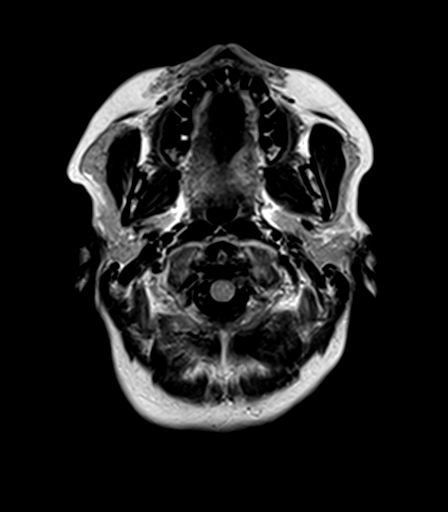
[im 13/25]
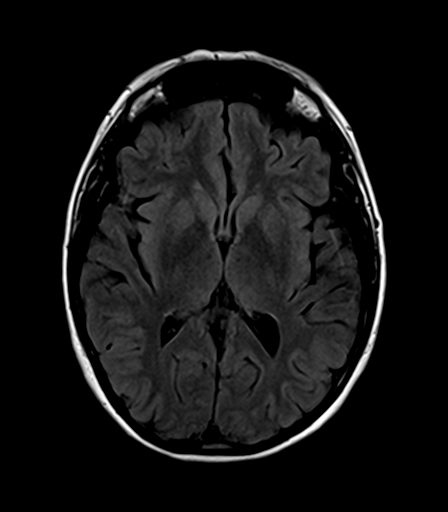
[im 25/25]
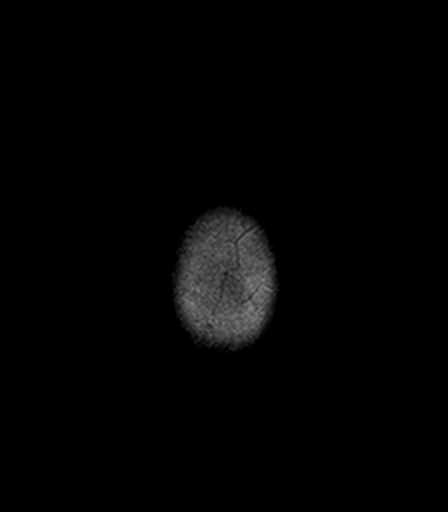

[Series 7: T2 · axial · 5.0mm · 0.72mm/px · z∈[-26,+117]mm · 3 of 25 slices shown (2 of 2)]
[im 1/25]
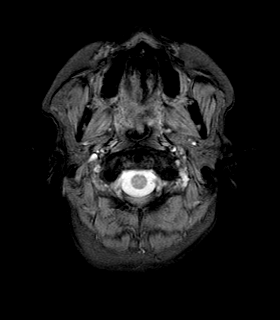
[im 13/25]
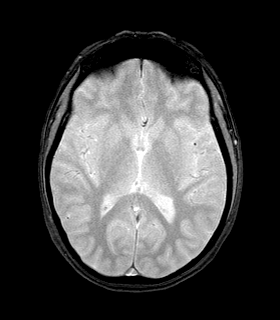
[im 25/25]
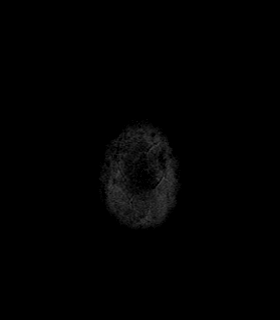

[Series 16: T1 post-contrast · sagittal · 3.0mm · 0.42mm/px · 2 of 15 slices shown (1 of 9)]
[im 1/15]
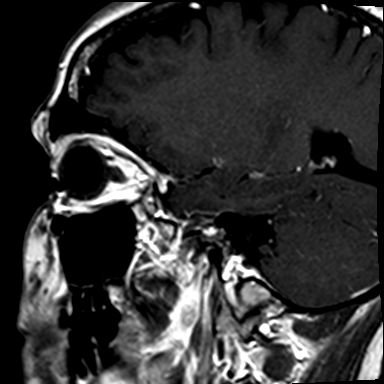
[im 15/15]
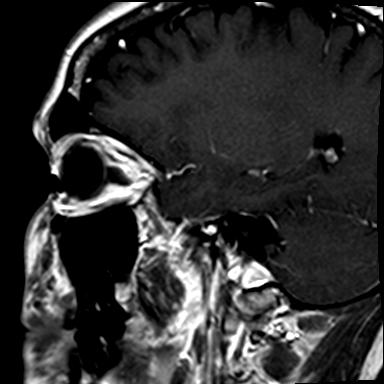

[Series 17: T1 post-contrast · coronal · 3.0mm · 0.42mm/px · 2 of 15 slices shown (2 of 9)]
[im 1/15]
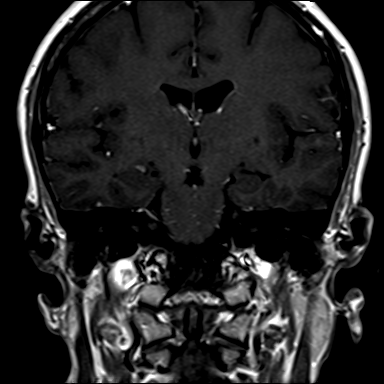
[im 15/15]
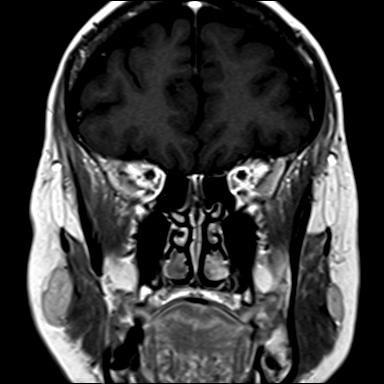

[Series 18: T1 post-contrast · axial · 3.0mm · 1.00mm/px · z∈[-29,+123]mm · 6 of 52 slices shown (3 of 9)]
[im 1/52]
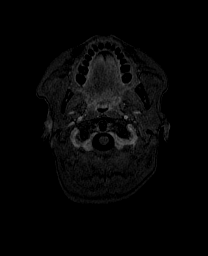
[im 11/52]
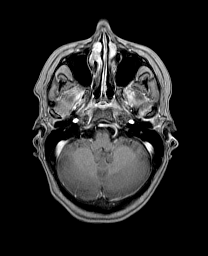
[im 21/52]
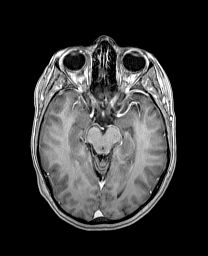
[im 31/52]
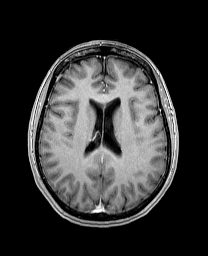
[im 41/52]
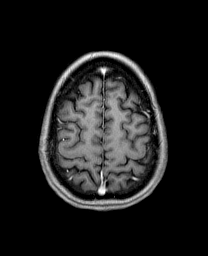
[im 52/52]
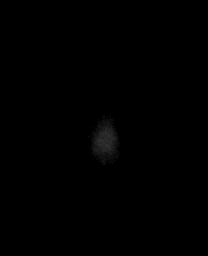

[Series 19: T1 post-contrast · coronal · 5.0mm · 0.45mm/px · 4 of 31 slices shown (4 of 9)]
[im 1/31]
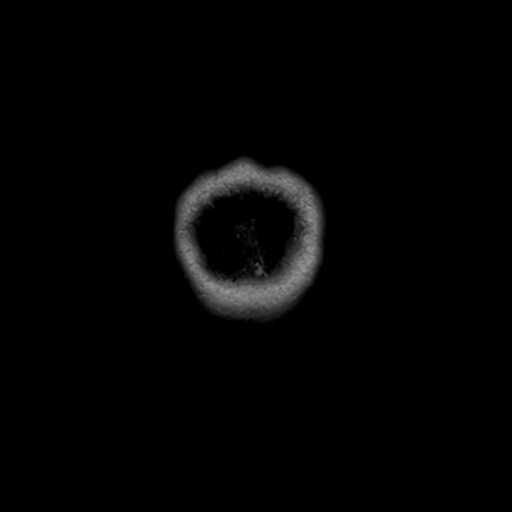
[im 11/31]
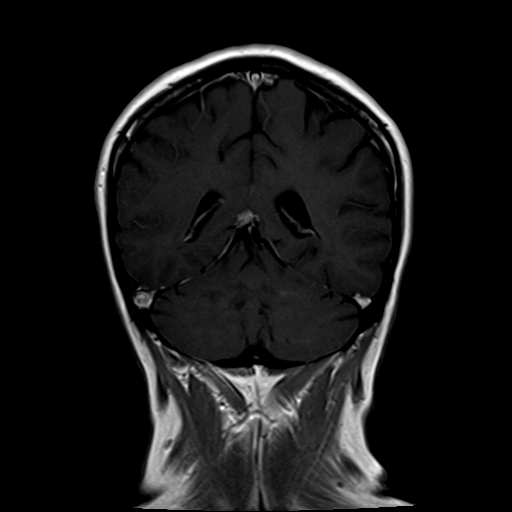
[im 21/31]
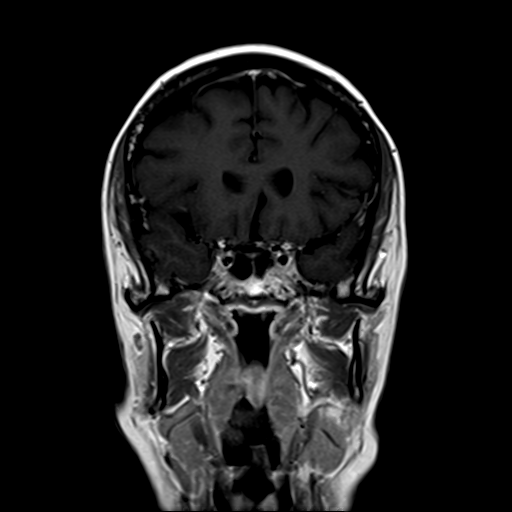
[im 31/31]
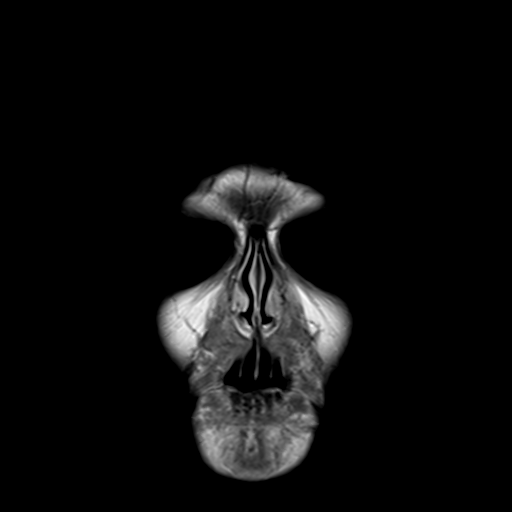

[Series 100: DWI · axial · 3.0mm · 1.20mm/px · z∈[-29,+117]mm · 6 of 50 slices shown]
[im 1/50]
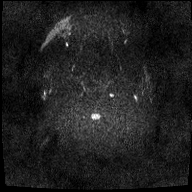
[im 10/50]
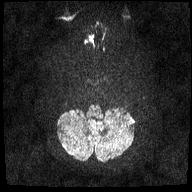
[im 20/50]
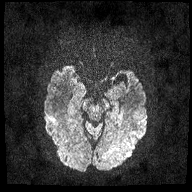
[im 30/50]
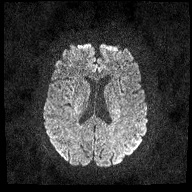
[im 40/50]
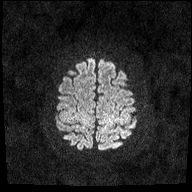
[im 50/50]
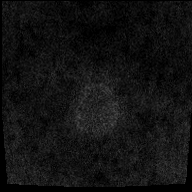

[Series 101: ADC · axial · 3.0mm · 1.20mm/px · z∈[-29,+117]mm · 6 of 50 slices shown]
[im 1/50]
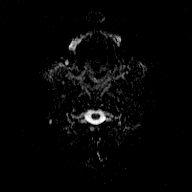
[im 10/50]
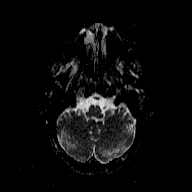
[im 20/50]
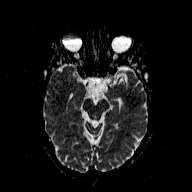
[im 30/50]
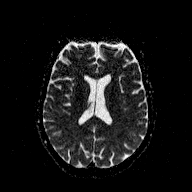
[im 40/50]
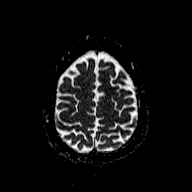
[im 50/50]
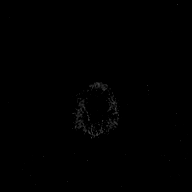

[Series 102: T1 post-contrast · coronal · 3.0mm · 0.42mm/px · 1 of 9 slices shown (5 of 9)]
[im 1/9]
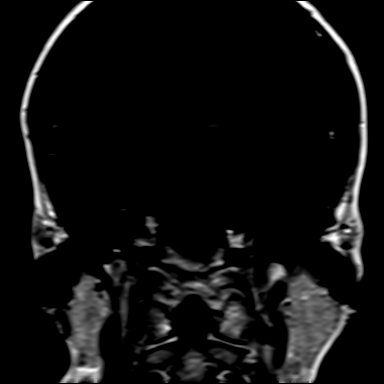

[Series 103: T1 post-contrast · coronal · 3.0mm · 0.42mm/px · 1 of 9 slices shown (6 of 9)]
[im 1/9]
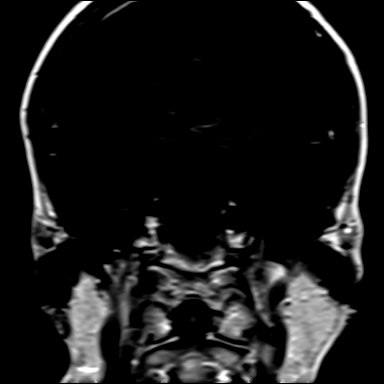

[Series 104: T1 post-contrast · coronal · 3.0mm · 0.42mm/px · 1 of 9 slices shown (7 of 9)]
[im 1/9]
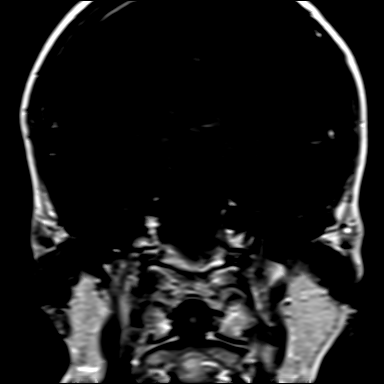

[Series 105: T1 post-contrast · coronal · 3.0mm · 0.42mm/px · 1 of 9 slices shown (8 of 9)]
[im 1/9]
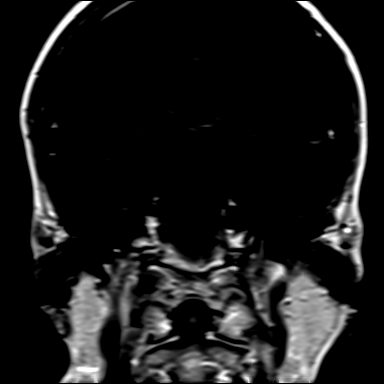

[Series 106: T1 post-contrast · coronal · 3.0mm · 0.42mm/px · 1 of 9 slices shown (9 of 9)]
[im 1/9]
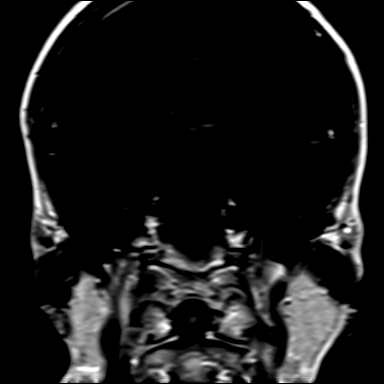

[42 of 48 positions shown; findings below may reference images not displayed]

FINDINGS: Dynamic pituitary protocol performed.

The pituitary is normal in size measuring 6 mm in height. Dynamic
scanning reveals hypo enhancing nodule in the left pituitary
measuring 4 mm. This is unchanged from the prior study. Infundibulum
not deviated. No invasion of the cavernous sinus. No compression of
the optic chiasm. No change from the prior study.

Ventricle size is normal.  Cerebral volume is normal.

Negative for acute infarct. Tiny frontal white matter
hyperintensities are nonspecific and not likely clinically
significant.

Negative for hemorrhage or fluid collection.

Normal enhancement of the entire brain following contrast infusion.
IMPRESSION: 4 mm pituitary microadenoma on the left is unchanged from 0264.

## 2017-02-26 ENCOUNTER — Ambulatory Visit: Payer: BC Managed Care – PPO | Admitting: Unknown Physician Specialty

## 2017-02-26 ENCOUNTER — Encounter: Payer: Self-pay | Admitting: Unknown Physician Specialty

## 2017-02-26 VITALS — BP 112/73 | HR 85 | Temp 98.2°F | Wt 156.8 lb

## 2017-02-26 DIAGNOSIS — R609 Edema, unspecified: Secondary | ICD-10-CM

## 2017-02-26 DIAGNOSIS — R8290 Unspecified abnormal findings in urine: Secondary | ICD-10-CM | POA: Diagnosis not present

## 2017-02-26 LAB — MICROSCOPIC EXAMINATION

## 2017-02-26 LAB — UA/M W/RFLX CULTURE, ROUTINE
Bilirubin, UA: NEGATIVE
GLUCOSE, UA: NEGATIVE
KETONES UA: NEGATIVE
Leukocytes, UA: NEGATIVE
NITRITE UA: NEGATIVE
Protein, UA: NEGATIVE
Specific Gravity, UA: 1.005 (ref 1.005–1.030)
UUROB: 0.2 mg/dL (ref 0.2–1.0)
pH, UA: 5 (ref 5.0–7.5)

## 2017-02-26 NOTE — Progress Notes (Signed)
BP 112/73   Pulse 85   Temp 98.2 F (36.8 C) (Oral)   Wt 156 lb 12.8 oz (71.1 kg) Comment: pt had boots on  SpO2 96%   BMI 25.31 kg/m    Subjective:    Patient ID: Isabel Richards, female    DOB: 1965/12/27, 51 y.o.   MRN: [id number]  HPI: Isabel Richards is a 51 y.o. female  Chief Complaint  Patient presents with  . Urinary Tract Infection    pt states she has been having a foul odor when urinating    Dysuria   This is a new problem. The current episode started more than 1 month ago. The problem occurs every urination. The problem has been unchanged. The patient is experiencing no pain. There has been no fever. She is not sexually active. There is no history of pyelonephritis. Associated symptoms include frequency. Pertinent negatives include no chills, discharge, flank pain, hematuria, hesitancy, nausea, possible pregnancy, sweats, urgency or vomiting. She has tried increased fluids for the symptoms. Her past medical history is significant for kidney stones and recurrent UTIs.   Fluid retention States she feels like she is retaining fluid with puffy eyes and difficulty getting rings off.    Relevant past medical, surgical, family and social history reviewed and updated as indicated. Interim medical history since our last visit reviewed. Allergies and medications reviewed and updated.  Review of Systems  Constitutional: Negative for chills.  Gastrointestinal: Negative for nausea and vomiting.  Genitourinary: Positive for dysuria and frequency. Negative for flank pain, hematuria, hesitancy and urgency.    Per HPI unless specifically indicated above     Objective:    BP 112/73   Pulse 85   Temp 98.2 F (36.8 C) (Oral)   Wt 156 lb 12.8 oz (71.1 kg) Comment: pt had boots on  SpO2 96%   BMI 25.31 kg/m   Wt Readings from Last 3 Encounters:  02/26/17 156 lb 12.8 oz (71.1 kg)  12/15/16 145 lb (65.8 kg)  10/09/16 150 lb (68 kg)    Physical Exam  Constitutional:  She is oriented to person, place, and time. She appears well-developed and well-nourished. No distress.  HENT:  Head: Normocephalic and atraumatic.  Eyes: Conjunctivae and lids are normal. Right eye exhibits no discharge. Left eye exhibits no discharge. No scleral icterus.  Neck: Normal range of motion. Neck supple. No JVD present. Carotid bruit is not present.  Cardiovascular: Normal rate, regular rhythm and normal heart sounds.  Pulmonary/Chest: Effort normal and breath sounds normal.  Abdominal: Soft. Normal appearance. She exhibits no distension. There is no splenomegaly or hepatomegaly. There is no tenderness. There is no rebound.  Musculoskeletal: Normal range of motion.  Neurological: She is alert and oriented to person, place, and time.  Skin: Skin is warm, dry and intact. No rash noted. No pallor.  Psychiatric: She has a normal mood and affect. Her behavior is normal. Judgment and thought content normal.    Results for orders placed or performed during the hospital encounter of 12/15/16  Urinalysis, Complete w Microscopic  Result Value Ref Range   Color, Urine YELLOW YELLOW   APPearance CLEAR CLEAR   Specific Gravity, Urine 1.010 1.005 - 1.030   pH 6.0 5.0 - 8.0   Glucose, UA NEGATIVE NEGATIVE mg/dL   Hgb urine dipstick SMALL (A) NEGATIVE   Bilirubin Urine NEGATIVE NEGATIVE   Ketones, ur NEGATIVE NEGATIVE mg/dL   Protein, ur NEGATIVE NEGATIVE mg/dL   Nitrite NEGATIVE NEGATIVE  Leukocytes, UA NEGATIVE NEGATIVE   Squamous Epithelial / LPF 6-30 (A) NONE SEEN   WBC, UA 0-5 0 - 5 WBC/hpf   RBC / HPF 0-5 0 - 5 RBC/hpf   Bacteria, UA RARE (A) NONE SEEN      Assessment & Plan:   Problem List Items Addressed This Visit    None    Visit Diagnoses    Abnormal urine odor    -  Primary   Urine is negative.  Pt ed that it may be related to diet or a supplement.  Pt does not recall any changes at this time.  Send urine for C&S   Relevant Orders   UA/M w/rflx Culture, Routine    Fluid retention       Subjective assessment.  Discussed diet changes      Flu shot updated and number iven for mammogram  Follow up plan: Return for August for physical+---------------+.

## 2017-02-26 NOTE — Patient Instructions (Signed)
Please do call to schedule your mammogram; the number to schedule one at either [facility] or [facility] is [phone]   

## 2023-06-04 ENCOUNTER — Encounter: Payer: Self-pay | Admitting: Emergency Medicine

## 2023-06-04 ENCOUNTER — Ambulatory Visit
Admission: EM | Admit: 2023-06-04 | Discharge: 2023-06-04 | Disposition: A | Discharge status: Home / Self Care | Attending: Family Medicine | Admitting: Family Medicine

## 2023-06-04 DIAGNOSIS — R112 Nausea with vomiting, unspecified: Secondary | ICD-10-CM | POA: Diagnosis not present

## 2023-06-04 MED ORDER — ONDANSETRON 8 MG PO TBDP
8.0000 mg | ORAL_TABLET | Freq: Once | ORAL | Status: AC
  Administered 2023-06-04: 8 mg via ORAL

## 2023-06-04 MED ORDER — METOCLOPRAMIDE HCL 5 MG/ML IJ SOLN
10.0000 mg | Freq: Once | INTRAMUSCULAR | Status: AC
Start: 2023-06-04 — End: 2023-06-04
  Administered 2023-06-04: 10 mg via INTRAMUSCULAR

## 2023-06-04 MED ORDER — ONDANSETRON HCL 4 MG/2ML IJ SOLN
4.0000 mg | Freq: Once | INTRAMUSCULAR | Status: AC
  Administered 2023-06-04: 4 mg via INTRAMUSCULAR

## 2023-06-04 NOTE — ED Notes (Signed)
 Patient is being discharged from the Urgent Care and sent to the Emergency Department via POV . Per Adela Glimpse, PA, patient is in need of higher level of care due to Vomiting after medications. Patient is aware and verbalizes understanding of plan of care.  Vitals:   06/04/23 0943  BP: 98/67  Pulse: (!) 104  Resp: 14  Temp: 97.7 F (36.5 C)  SpO2: 97%

## 2023-06-04 NOTE — ED Triage Notes (Signed)
 Patient reports vomiting and chills that started on Wed.  Patient unsure of fevers.

## 2023-06-04 NOTE — Discharge Instructions (Addendum)
 Please go to the ER for further evaluation of your symptoms

## 2023-06-04 NOTE — ED Provider Notes (Signed)
 [facility]    CSN: [id number] Arrival date & time: 06/04/23  0859      History   Chief Complaint Chief Complaint  Patient presents with   Emesis    HPI Isabel Richards is a 58 y.o. female  presents for evaluation of URI symptoms for 2 days. Patient reports associated symptoms of nausea/vomiting with chills. Denies diarrhea, cough, congestion, sore throat, fevers, body aches, shortness of breath, dysuria, hematuria, flank or low back pain.  Denies any history of GI diagnoses such as Crohn's, IBS, GERD, diverticulitis.  No known sick contacts.  No recent travel or ingestion of contaminated or undercooked foods.  Denies any new medications.  States cannot keep anything down and has been urinating about twice a day.  Pt has taken nothing OTC for symptoms. Pt has no other concerns at this time.    Emesis   Past Medical History:  Diagnosis Date   ADHD    Anxiety    Pituitary adenoma [facility])    Thyroid disease    Wears contact lenses    right eye    Patient Active Problem List   Diagnosis Date Noted   Special screening for malignant neoplasms, colon    Benign neoplasm of cecum    ADHD 08/07/2016   Anxiety 08/07/2016   Depression 08/07/2016   Pituitary adenoma (HCC) 08/07/2016   History of substance abuse (HCC) 08/07/2016   Menopause 08/07/2016    Past Surgical History:  Procedure Laterality Date   ABDOMINAL HYSTERECTOMY     COLONOSCOPY WITH PROPOFOL N/A 10/09/2016   Procedure: COLONOSCOPY WITH PROPOFOL;  Surgeon: Midge Minium, MD;  Location: [facility];  Service: Endoscopy;  Laterality: N/A;   KIDNEY STONE SURGERY     removal   POLYPECTOMY  10/09/2016   Procedure: POLYPECTOMY;  Surgeon: Midge Minium, MD;  Location: [facility];  Service: Endoscopy;;    OB History   No obstetric history on file.      Home Medications    Prior to Admission medications   Medication Sig Start Date End Date Taking? Authorizing Provider  clonazePAM  (KLONOPIN) 1 MG tablet Take 1 mg by mouth 3 (three) times daily as needed. 05/27/23  Yes [provider]  acetaminophen (TYLENOL) 500 MG tablet Take 2 tablets (1,000 mg total) by mouth every 6 (six) hours as needed for moderate pain, fever or headache. 09/21/14   Betancourt, Jarold Song, NP  Buprenorphine HCl-Naloxone HCl (SUBOXONE) 2-0.5 MG FILM Place 2 mg under the tongue daily.  06/19/16   [provider]  buPROPion (WELLBUTRIN XL) 150 MG 24 hr tablet Take 150 mg by mouth 2 (two) times daily.    [provider]  clonazePAM (KLONOPIN) 0.5 MG tablet TK 1 T PO QAM AND 2 TS QHS PRN. PLUS 1 T QD PRA 07/18/16   [provider]  methylphenidate (RITALIN) 10 MG tablet Take by mouth. Take 1 to 2 tablets by mouth daily 06/19/16   [provider]    Family History Family History  Problem Relation Age of Onset   Cancer Mother        pancreatic   Prostatitis Father    Lymphoma Brother    Stroke Maternal Grandfather    Heart attack Maternal Grandfather    Cancer Paternal Grandmother        breast   Alzheimer's disease Paternal Grandmother    Heart attack Paternal Grandfather     Social History Social History   Tobacco Use  Smoking status: Former    Current packs/day: 0.00    Types: Cigarettes    Quit date: 08/08/2011    Years since quitting: 11.8   Smokeless tobacco: Never   Tobacco comments:    husband still smokes  Vaping Use   Vaping status: Never Used  Substance Use Topics   Alcohol use: No   Drug use: No     Allergies   Pollen extract   Review of Systems Review of Systems  Gastrointestinal:  Positive for nausea and vomiting.     Physical Exam Triage Vital Signs ED Triage Vitals  Encounter Vitals Group     BP 06/04/23 0943 98/67     Systolic BP Percentile --      Diastolic BP Percentile --      Pulse Rate 06/04/23 0943 (!) 104     Resp 06/04/23 0943 14     Temp 06/04/23 0943 97.7 F (36.5 C)     Temp Source 06/04/23 0943 Oral      SpO2 06/04/23 0943 97 %     Weight 06/04/23 0941 156 lb 12 oz (71.1 kg)     Height 06/04/23 0941 5\' 6"  (1.676 m)     Head Circumference --      Peak Flow --      Pain Score 06/04/23 0940 0     Pain Loc --      Pain Education --      Exclude from Growth Chart --    No data found.  Updated Vital Signs BP 98/67 (BP Location: Right Arm)   Pulse (!) 104   Temp 97.7 F (36.5 C) (Oral)   Resp 14   Ht 5\' 6"  (1.676 m)   Wt 156 lb 12 oz (71.1 kg)   SpO2 97%   BMI 25.30 kg/m   Visual Acuity Right Eye Distance:   Left Eye Distance:   Bilateral Distance:    Right Eye Near:   Left Eye Near:    Bilateral Near:     Physical Exam Vitals and nursing note reviewed.  Constitutional:      General: She is not in acute distress.    Appearance: Normal appearance. She is not ill-appearing.  HENT:     Head: Normocephalic and atraumatic.  Eyes:     Pupils: Pupils are equal, round, and reactive to light.  Cardiovascular:     Rate and Rhythm: Tachycardia present.     Comments: Mildly tachy at 104 Pulmonary:     Effort: Pulmonary effort is normal.  Abdominal:     General: Bowel sounds are normal. There is no distension.     Palpations: Abdomen is soft.     Tenderness: There is no abdominal tenderness. There is no right CVA tenderness, left CVA tenderness, guarding or rebound.  Skin:    General: Skin is warm and dry.  Neurological:     General: No focal deficit present.     Mental Status: She is alert and oriented to person, place, and time.  Psychiatric:        Mood and Affect: Mood normal.        Behavior: Behavior normal.      UC Treatments / Results  Labs (all labs ordered are listed, but only abnormal results are displayed) Labs Reviewed - No data to display  EKG   Radiology No results found.  Procedures Procedures (including critical care time)  Medications Ordered in UC Medications  ondansetron (ZOFRAN-ODT) disintegrating tablet 8 mg (8 mg  Oral Given 06/04/23  0944)  ondansetron [facility]) injection 4 mg (4 mg Intramuscular Given 06/04/23 0959)  metoCLOPramide (REGLAN) injection 10 mg (10 mg Intramuscular Given 06/04/23 1102)    Initial Impression / Assessment and Plan / UC Course  I have reviewed the triage vital signs and the nursing notes.  Pertinent labs & imaging results that were available during my care of the patient were reviewed by me and considered in my medical decision making (see chart for details).     Patient was given sublingual Zofran by nursing staff but she vomited this up.  She was given IM Zofran. Stated she felt better but after drinking a few sips of water, she vomited again. Pt then given IM reglan.  Patient continued to vomit.  Given failed control of symptoms with 2 different medications advised to go to the emergency room for further workup and treatment.  She is in agreement with plan to go POV with her husband driving to the emergency room. She declines any flu or COVID testing. Final Clinical Impressions(s) / UC Diagnoses   Final diagnoses:  Nausea and vomiting, unspecified vomiting type   Discharge Instructions   None    ED Prescriptions   None    PDMP not reviewed this encounter.   Radford Pax, NP 06/04/23 1131
